# Patient Record
Sex: Male | Born: 1960 | Race: White | Hispanic: No | Marital: Married | Smoking: Never smoker
Health system: Southern US, Community
[De-identification: ages and names within clinical notes are randomized; demographics above are authoritative.]

## PROBLEM LIST (undated history)

## (undated) DIAGNOSIS — J45909 Unspecified asthma, uncomplicated: Secondary | ICD-10-CM

## (undated) HISTORY — PX: HERNIA REPAIR: SHX51

---

## 2010-04-09 ENCOUNTER — Ambulatory Visit: Payer: Self-pay | Admitting: Family Medicine

## 2012-02-10 IMAGING — CR DG CHEST 2V
1 series · 2 of 2 positions shown · non-contrast
Comparison: none

REASON FOR EXAM: cough
COMMENTS:

PROCEDURE:     DXR - DXR CHEST PA (OR AP) AND LATERAL  - April 09, 2010 [DATE]
RESULT:     The lungs are clear. The cardiac silhouette and visualized bony
skeleton are unremarkable.

[Series 1: view not recorded · 0.17mm/px · 2 of 2 slices shown]
[im 1/2]
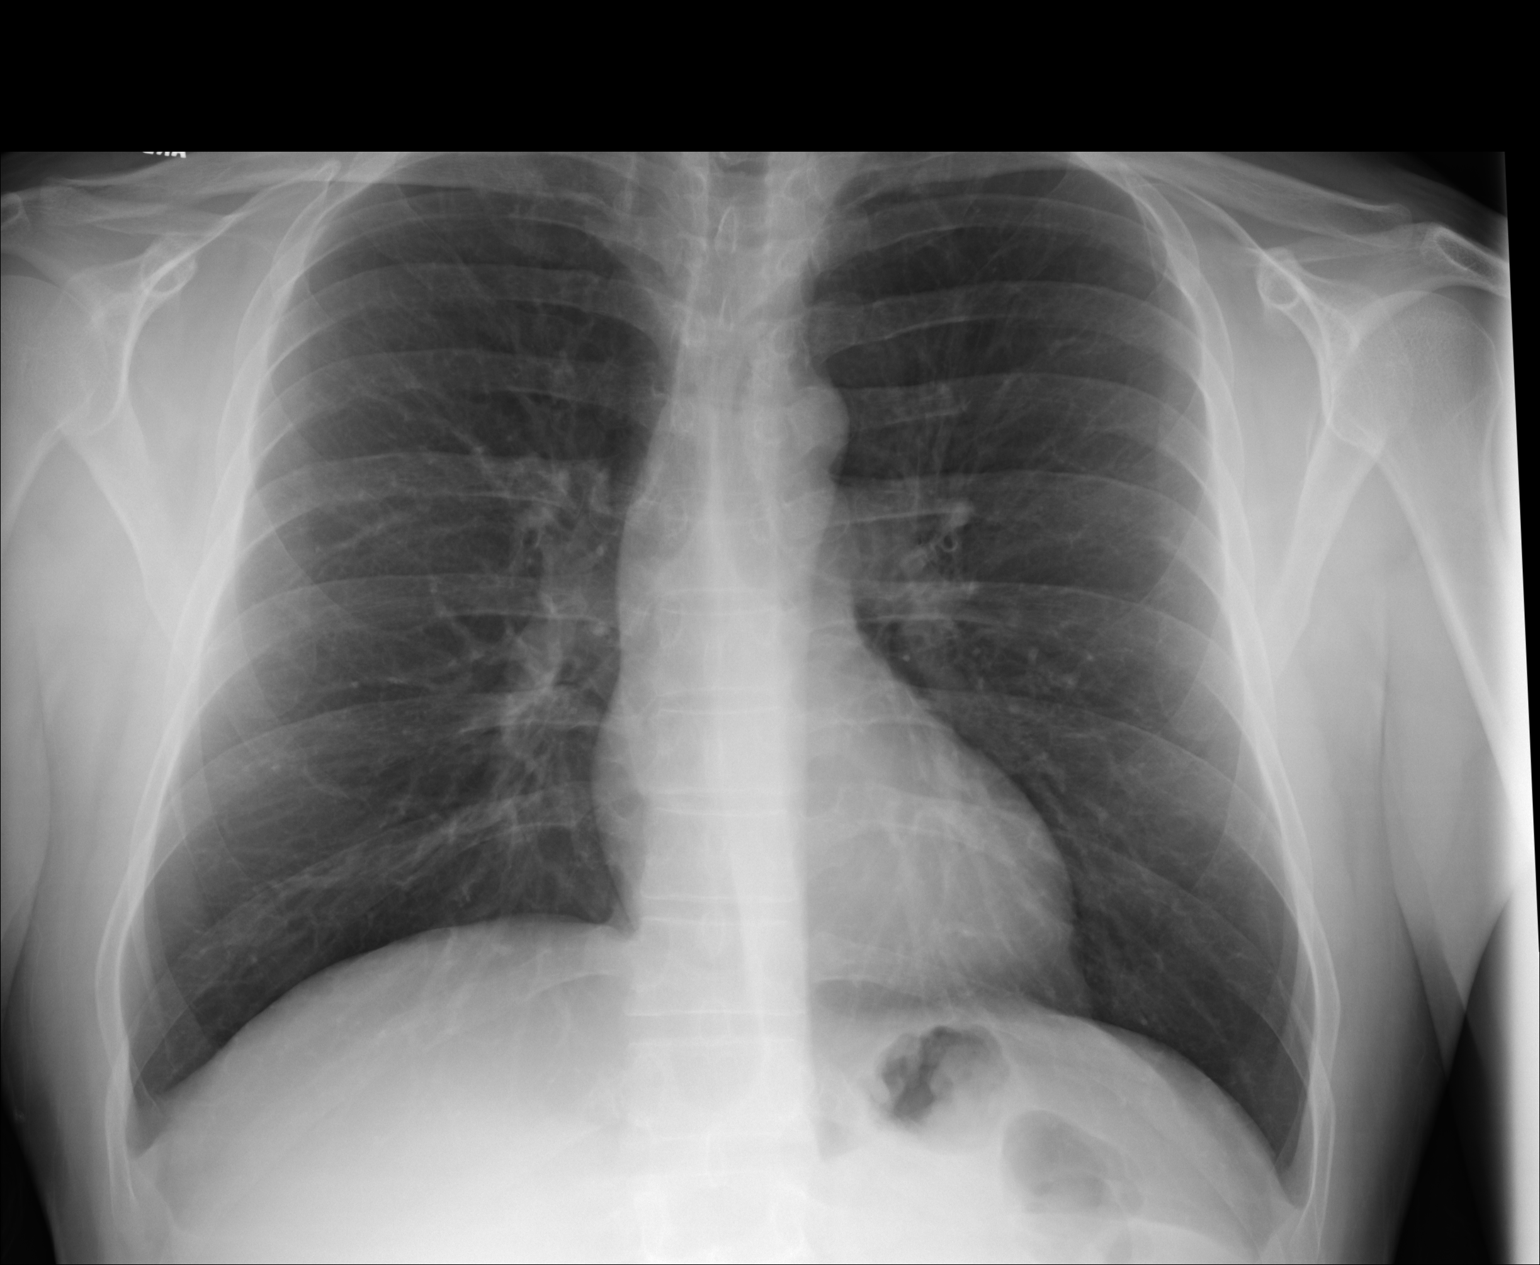
[im 2/2]
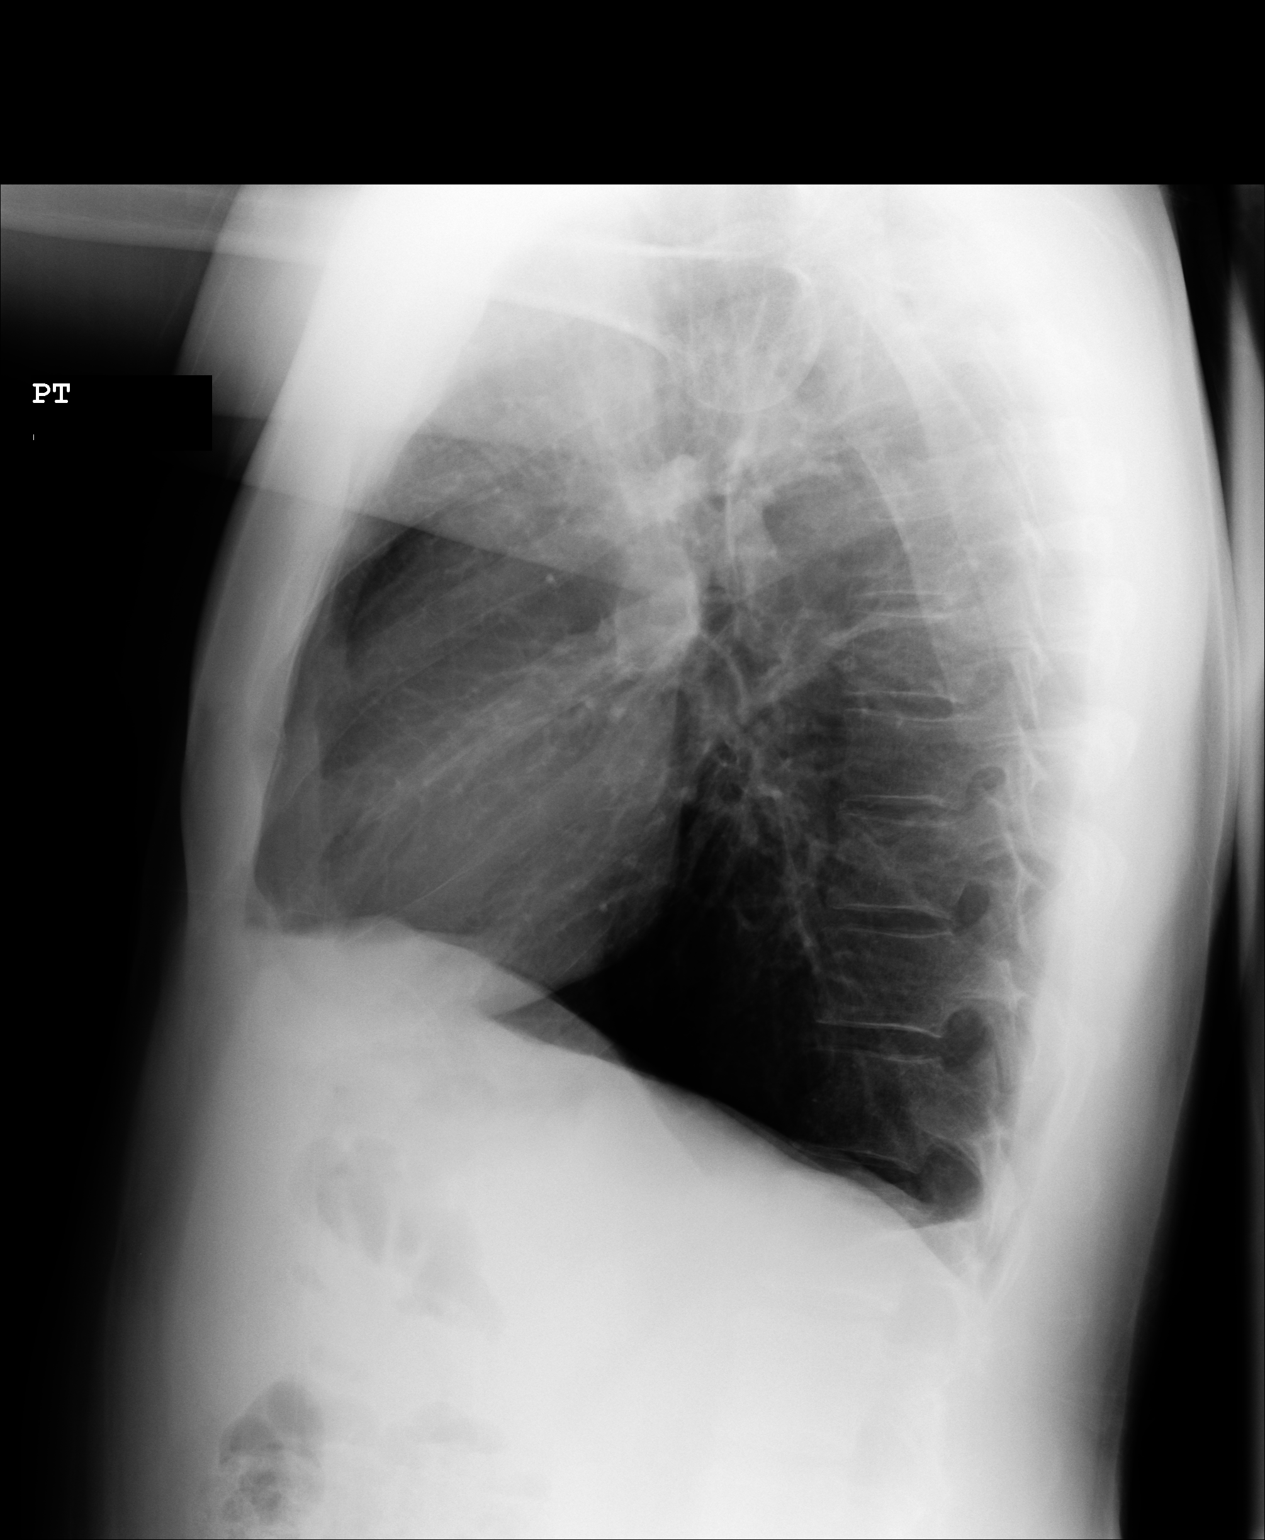

[2 of 2 positions shown; findings below may reference images not displayed]

IMPRESSION: Chest radiograph without evidence of acute cardiopulmonary disease.

## 2013-07-26 ENCOUNTER — Ambulatory Visit: Payer: Self-pay | Admitting: Gastroenterology

## 2020-04-21 ENCOUNTER — Emergency Department: Payer: BLUE CROSS/BLUE SHIELD

## 2020-04-21 ENCOUNTER — Other Ambulatory Visit: Payer: Self-pay

## 2020-04-21 DIAGNOSIS — K219 Gastro-esophageal reflux disease without esophagitis: Secondary | ICD-10-CM | POA: Diagnosis present

## 2020-04-21 DIAGNOSIS — Z79899 Other long term (current) drug therapy: Secondary | ICD-10-CM

## 2020-04-21 DIAGNOSIS — J45909 Unspecified asthma, uncomplicated: Secondary | ICD-10-CM | POA: Diagnosis present

## 2020-04-21 DIAGNOSIS — Z20822 Contact with and (suspected) exposure to covid-19: Secondary | ICD-10-CM | POA: Diagnosis present

## 2020-04-21 DIAGNOSIS — Z8249 Family history of ischemic heart disease and other diseases of the circulatory system: Secondary | ICD-10-CM

## 2020-04-21 DIAGNOSIS — E785 Hyperlipidemia, unspecified: Secondary | ICD-10-CM | POA: Diagnosis present

## 2020-04-21 DIAGNOSIS — I214 Non-ST elevation (NSTEMI) myocardial infarction: Principal | ICD-10-CM | POA: Diagnosis present

## 2020-04-21 DIAGNOSIS — Z28310 Unvaccinated for covid-19: Secondary | ICD-10-CM

## 2020-04-21 LAB — CBC
HCT: 45.4 % (ref 39.0–52.0)
Hemoglobin: 15.5 g/dL (ref 13.0–17.0)
MCH: 28.4 pg (ref 26.0–34.0)
MCHC: 34.1 g/dL (ref 30.0–36.0)
MCV: 83.3 fL (ref 80.0–100.0)
Platelets: 305 10*3/uL (ref 150–400)
RBC: 5.45 MIL/uL (ref 4.22–5.81)
RDW: 14.6 % (ref 11.5–15.5)
WBC: 9.8 10*3/uL (ref 4.0–10.5)
nRBC: 0 % (ref 0.0–0.2)

## 2020-04-21 NOTE — ED Notes (Signed)
First nurse note: pt to ED via EMS from home c/o substernal chest pain 2-3 hours PTA.  Given 3 SL nitro sprays by EMS, taken 5-6 baby ASA at home.  EMS vitals 160/90 BP, 75 HR, 95% RA, 144 CBG.

## 2020-04-21 NOTE — ED Triage Notes (Signed)
Pt presents to ER c/o mid-sternal chest pain that started around 1800 tonight.  Pt states pain was gradual onset on burning type chest pain.  Pt states he took 4 baby aspirin and 3 tums at home pta and at this moment, he states his pain is mostly gone.  Pt denies cardiac hx, HTN.  Pt A&O x4 at this time in NAD.

## 2020-04-22 ENCOUNTER — Inpatient Hospital Stay
Admission: EM | Admit: 2020-04-22 | Discharge: 2020-04-24 | DRG: 282 | Disposition: A | Discharge status: Home / Self Care | Payer: BLUE CROSS/BLUE SHIELD | Attending: Internal Medicine | Admitting: Internal Medicine

## 2020-04-22 ENCOUNTER — Encounter: Payer: Self-pay | Admitting: Internal Medicine

## 2020-04-22 DIAGNOSIS — Z79899 Other long term (current) drug therapy: Secondary | ICD-10-CM | POA: Diagnosis not present

## 2020-04-22 DIAGNOSIS — I214 Non-ST elevation (NSTEMI) myocardial infarction: Secondary | ICD-10-CM

## 2020-04-22 DIAGNOSIS — J45909 Unspecified asthma, uncomplicated: Secondary | ICD-10-CM

## 2020-04-22 DIAGNOSIS — Z20822 Contact with and (suspected) exposure to covid-19: Secondary | ICD-10-CM | POA: Diagnosis present

## 2020-04-22 DIAGNOSIS — E785 Hyperlipidemia, unspecified: Secondary | ICD-10-CM | POA: Diagnosis present

## 2020-04-22 DIAGNOSIS — Z8249 Family history of ischemic heart disease and other diseases of the circulatory system: Secondary | ICD-10-CM | POA: Diagnosis not present

## 2020-04-22 DIAGNOSIS — T7840XA Allergy, unspecified, initial encounter: Secondary | ICD-10-CM

## 2020-04-22 DIAGNOSIS — K219 Gastro-esophageal reflux disease without esophagitis: Secondary | ICD-10-CM

## 2020-04-22 DIAGNOSIS — Z28310 Unvaccinated for covid-19: Secondary | ICD-10-CM | POA: Diagnosis not present

## 2020-04-22 HISTORY — DX: Unspecified asthma, uncomplicated: J45.909

## 2020-04-22 LAB — BASIC METABOLIC PANEL
Anion gap: 10 (ref 5–15)
Anion gap: 7 (ref 5–15)
BUN: 23 mg/dL — ABNORMAL HIGH (ref 6–20)
BUN: 20 mg/dL (ref 6–20)
CO2: 27 mmol/L (ref 22–32)
CO2: 26 mmol/L (ref 22–32)
Calcium: 9.9 mg/dL (ref 8.9–10.3)
Calcium: 9.6 mg/dL (ref 8.9–10.3)
Chloride: 103 mmol/L (ref 98–111)
Chloride: 107 mmol/L (ref 98–111)
Creatinine, Ser: 0.94 mg/dL (ref 0.61–1.24)
Creatinine, Ser: 0.88 mg/dL (ref 0.61–1.24)
GFR, Estimated: >60 mL/min (ref >60)
GFR, Estimated: >60 mL/min (ref >60)
Glucose, Bld: 100 mg/dL — ABNORMAL HIGH (ref 70–99)
Glucose, Bld: 96 mg/dL (ref 70–99)
Potassium: 3.9 mmol/L (ref 3.5–5.1)
Potassium: 3.8 mmol/L (ref 3.5–5.1)
Sodium: 140 mmol/L (ref 135–145)
Sodium: 140 mmol/L (ref 135–145)

## 2020-04-22 LAB — TROPONIN I (HIGH SENSITIVITY)
Troponin I (High Sensitivity): 159 ng/L (ref <18)
Troponin I (High Sensitivity): 3408 ng/L (ref <18)
Troponin I (High Sensitivity): 4061 ng/L (ref <18)
Troponin I (High Sensitivity): 725 ng/L (ref <18)

## 2020-04-22 LAB — CBC
HCT: 45.5 % (ref 39.0–52.0)
Hemoglobin: 15.2 g/dL (ref 13.0–17.0)
MCH: 28.2 pg (ref 26.0–34.0)
MCHC: 33.4 g/dL (ref 30.0–36.0)
MCV: 84.4 fL (ref 80.0–100.0)
Platelets: 275 10*3/uL (ref 150–400)
RBC: 5.39 MIL/uL (ref 4.22–5.81)
RDW: 14.6 % (ref 11.5–15.5)
WBC: 8.3 10*3/uL (ref 4.0–10.5)
nRBC: 0 % (ref 0.0–0.2)

## 2020-04-22 LAB — HEPATIC FUNCTION PANEL
ALT: 14 U/L (ref 0–44)
AST: 25 U/L (ref 15–41)
Albumin: 2.5 g/dL — ABNORMAL LOW (ref 3.5–5.0)
Alkaline Phosphatase: 39 U/L (ref 38–126)
Bilirubin, Direct: 0.1 mg/dL (ref 0.0–0.2)
Indirect Bilirubin: 0.5 mg/dL (ref 0.3–0.9)
Total Bilirubin: 0.6 mg/dL (ref 0.3–1.2)
Total Protein: 5.8 g/dL — ABNORMAL LOW (ref 6.5–8.1)

## 2020-04-22 LAB — RESP PANEL BY RT-PCR (FLU A&B, COVID) ARPGX2
Influenza A by PCR: NEGATIVE
Influenza B by PCR: NEGATIVE
SARS Coronavirus 2 by RT PCR: NEGATIVE

## 2020-04-22 LAB — TSH: TSH: 3.621 u[IU]/mL (ref 0.350–4.500)

## 2020-04-22 LAB — PROTIME-INR
INR: 1 (ref 0.8–1.2)
Prothrombin Time: 12.7 seconds (ref 11.4–15.2)

## 2020-04-22 LAB — LIPID PANEL
Cholesterol: 200 mg/dL (ref 0–200)
HDL: 52 mg/dL (ref >40)
LDL Cholesterol: 124 mg/dL — ABNORMAL HIGH (ref 0–99)
Total CHOL/HDL Ratio: 3.8 RATIO
Triglycerides: 120 mg/dL (ref <150)
VLDL: 24 mg/dL (ref 0–40)

## 2020-04-22 LAB — HEPARIN LEVEL (UNFRACTIONATED)
Heparin Unfractionated: 0.33 IU/mL (ref 0.30–0.70)
Heparin Unfractionated: 0.17 IU/mL — ABNORMAL LOW (ref 0.30–0.70)
Heparin Unfractionated: 0.29 IU/mL — ABNORMAL LOW (ref 0.30–0.70)

## 2020-04-22 LAB — HEMOGLOBIN A1C
Hgb A1c MFr Bld: 5.7 % — ABNORMAL HIGH (ref 4.8–5.6)
Mean Plasma Glucose: 116.89 mg/dL

## 2020-04-22 LAB — APTT: aPTT: 136 seconds — ABNORMAL HIGH (ref 24–36)

## 2020-04-22 LAB — HIV ANTIBODY (ROUTINE TESTING W REFLEX): HIV Screen 4th Generation wRfx: NONREACTIVE

## 2020-04-22 LAB — MAGNESIUM: Magnesium: 2 mg/dL (ref 1.7–2.4)

## 2020-04-22 LAB — LIPASE, BLOOD: Lipase: 23 U/L (ref 11–51)

## 2020-04-22 MED ORDER — ATORVASTATIN CALCIUM 20 MG PO TABS
40.0000 mg | ORAL_TABLET | Freq: Every day | ORAL | Status: DC
  Administered 2020-04-22 – 2020-04-23 (×2): 40 mg via ORAL
  Filled 2020-04-22 (×2): qty 2

## 2020-04-22 MED ORDER — NITROGLYCERIN 0.4 MG SL SUBL: 0.4000 mg | SUBLINGUAL_TABLET | SUBLINGUAL | Status: DC | PRN

## 2020-04-22 MED ORDER — HEPARIN BOLUS VIA INFUSION
1100.0000 [IU] | Freq: Once | INTRAVENOUS | Status: AC
  Administered 2020-04-22: 1100 [IU] via INTRAVENOUS
  Filled 2020-04-22: qty 1100

## 2020-04-22 MED ORDER — ASPIRIN EC 81 MG PO TBEC
81.0000 mg | DELAYED_RELEASE_TABLET | Freq: Every day | ORAL | Status: DC
  Administered 2020-04-23: 81 mg via ORAL
  Filled 2020-04-22: qty 1

## 2020-04-22 MED ORDER — HEPARIN (PORCINE) 25000 UT/250ML-% IV SOLN: 14.0000 [IU]/kg/h | INTRAVENOUS | Status: DC

## 2020-04-22 MED ORDER — ONDANSETRON HCL 4 MG/2ML IJ SOLN: 4.0000 mg | Freq: Four times a day (QID) | INTRAMUSCULAR | Status: DC | PRN

## 2020-04-22 MED ORDER — FAMOTIDINE 20 MG PO TABS
40.0000 mg | ORAL_TABLET | Freq: Every day | ORAL | Status: DC
  Administered 2020-04-23: 40 mg via ORAL
  Filled 2020-04-22: qty 2

## 2020-04-22 MED ORDER — METOPROLOL TARTRATE 25 MG PO TABS
12.5000 mg | ORAL_TABLET | Freq: Two times a day (BID) | ORAL | Status: DC
  Administered 2020-04-22 – 2020-04-23 (×4): 12.5 mg via ORAL
  Filled 2020-04-22 (×4): qty 1

## 2020-04-22 MED ORDER — NITROGLYCERIN 2 % TD OINT
1.0000 [in_us] | TOPICAL_OINTMENT | Freq: Once | TRANSDERMAL | Status: AC
  Administered 2020-04-22: 1 [in_us] via TOPICAL
  Filled 2020-04-22: qty 1

## 2020-04-22 MED ORDER — FAMOTIDINE IN NACL 20-0.9 MG/50ML-% IV SOLN
20.0000 mg | Freq: Once | INTRAVENOUS | Status: AC
  Administered 2020-04-22: 20 mg via INTRAVENOUS
  Filled 2020-04-22: qty 50

## 2020-04-22 MED ORDER — HEPARIN SODIUM (PORCINE) 5000 UNIT/ML IJ SOLN
4000.0000 [IU] | Freq: Once | INTRAMUSCULAR | Status: AC
  Administered 2020-04-22: 4000 [IU] via INTRAVENOUS

## 2020-04-22 MED ORDER — HEPARIN (PORCINE) 25000 UT/250ML-% IV SOLN
1350.0000 [IU]/h | INTRAVENOUS | Status: DC
  Administered 2020-04-22: 1200 [IU]/h via INTRAVENOUS
  Administered 2020-04-22: 1000 [IU]/h via INTRAVENOUS
  Administered 2020-04-23: 1350 [IU]/h via INTRAVENOUS
  Filled 2020-04-22 (×3): qty 250

## 2020-04-22 MED ORDER — SODIUM CHLORIDE 0.9% FLUSH
3.0000 mL | Freq: Two times a day (BID) | INTRAVENOUS | Status: DC
  Administered 2020-04-22 – 2020-04-23 (×2): 3 mL via INTRAVENOUS

## 2020-04-22 MED ORDER — HEPARIN BOLUS VIA INFUSION
2100.0000 [IU] | Freq: Once | INTRAVENOUS | Status: AC
  Administered 2020-04-22: 2100 [IU] via INTRAVENOUS
  Filled 2020-04-22: qty 2100

## 2020-04-22 MED ORDER — MONTELUKAST SODIUM 10 MG PO TABS
10.0000 mg | ORAL_TABLET | Freq: Every day | ORAL | Status: DC
  Administered 2020-04-22 – 2020-04-23 (×2): 10 mg via ORAL
  Filled 2020-04-22 (×4): qty 1

## 2020-04-22 MED ORDER — ACETAMINOPHEN 325 MG PO TABS
650.0000 mg | ORAL_TABLET | ORAL | Status: DC | PRN
  Administered 2020-04-22: 650 mg via ORAL
  Filled 2020-04-22 (×3): qty 2

## 2020-04-22 NOTE — Progress Notes (Signed)
ANTICOAGULATION CONSULT NOTE   Pharmacy Consult for heparin infusion Indication: ACS/STEMI  No Known Allergies  Patient Measurements: Height: 5\' 6"  (167.6 cm) Weight: 72.6 kg (160 lb 1.6 oz) IBW/kg (Calculated) : 63.8 Heparin Dosing Weight: 73.5 kg  Vital Signs: Temp: 98.5 F (36.9 C) (04/09 1127) Temp Source: Oral (04/09 1127) BP: 127/79 (04/09 1127) Pulse Rate: 61 (04/09 1127)  Labs: Recent Labs    04/21/20 2206 04/22/20 0016 04/22/20 0401 04/22/20 0711 04/22/20 1343  HGB 15.5  --  15.2  --   --   HCT 45.4  --  45.5  --   --   PLT 305  --  275  --   --   APTT  --   --  136*  --   --   LABPROT  --   --  12.7  --   --   INR  --   --  1.0  --   --   HEPARINUNFRC  --   --   --  0.33 0.17*  CREATININE 0.94  --  0.88  --   --   TROPONINIHS 159* 725* 3,408* 4,061*  --     Estimated Creatinine Clearance: 83.6 mL/min (by C-G formula based on SCr of 0.88 mg/dL).   Medical History: Past Medical History:  Diagnosis Date  . Asthma     Assessment: Pt is 60 yo male presenting w/ CP, Troponin 159 >> 725  4/9 0711 HL 0.33  4/9 1343 HL 0.17   Goal of Therapy:  Heparin level 0.3-0.7 units/ml Monitor platelets by anticoagulation protocol: Yes   Plan:  Heparin level is subtherapeutic. Will give a 2100 units bolus and increase the heparin infusion to 1200 units/hr. Recheck heparin level in 6 hours. CBC daily while on heparin. Plan for cath Monday.   Wednesday, PharmD, BCPS 04/22/2020 2:19 PM

## 2020-04-22 NOTE — ED Notes (Signed)
Patient and wife updated on plan of care and lab results. Patient verbalized understanding.

## 2020-04-22 NOTE — Progress Notes (Signed)
ANTICOAGULATION CONSULT NOTE   Pharmacy Consult for heparin infusion Indication: ACS/STEMI  No Known Allergies  Patient Measurements: Height: 5\' 6"  (167.6 cm) Weight: 72.6 kg (160 lb 1.6 oz) IBW/kg (Calculated) : 63.8 Heparin Dosing Weight: 73.5 kg  Vital Signs: Temp: 98 F (36.7 C) (04/09 0727) Temp Source: Oral (04/09 0252) BP: 115/68 (04/09 0727) Pulse Rate: 57 (04/09 0727)  Labs: Recent Labs    04/21/20 2206 04/22/20 0016 04/22/20 0401 04/22/20 0711  HGB 15.5  --  15.2  --   HCT 45.4  --  45.5  --   PLT 305  --  275  --   APTT  --   --  136*  --   LABPROT  --   --  12.7  --   INR  --   --  1.0  --   HEPARINUNFRC  --   --   --  0.33  CREATININE 0.94  --  0.88  --   TROPONINIHS 159* 725* 3,408* 4,061*    Estimated Creatinine Clearance: 83.6 mL/min (by C-G formula based on SCr of 0.88 mg/dL).   Medical History: Past Medical History:  Diagnosis Date  . Asthma     Assessment: Pt is 60 yo male presenting w/ CP, Troponin 159 >> 725  4/9 0711 HL 0.33   Goal of Therapy:  Heparin level 0.3-0.7 units/ml Monitor platelets by anticoagulation protocol: Yes   Plan:  Heparin level is therapeutic. Will continue heparin infusion at 1000 unit/hr. Recheck heparin level in 6 hours. CBC daily while on heparin.   6/9, PharmD, BCPS 04/22/2020 8:01 AM

## 2020-04-22 NOTE — ED Notes (Signed)
See triage note. Patient reports pain to center chest after working in the yard. Patient reports pain has improved at this time, but is still present in the center chest. Patient denies SOB.

## 2020-04-22 NOTE — H&P (Addendum)
History and Physical   Ricky Ryan CNO:[id number] DOB: April 15, 1960 DOA: 04/22/2020  PCP: Ricky Housekeeper, MD   Patient coming from: Home  Chief Complaint: Chest pain  HPI: Ricky Ryan is a 60 y.o. male with medical history significant of asthma, GERD who presented with chest pain.  Patient had onset of chest pain around 6 PM this evening. He had a gradual increase in the pain which had a burning nature to it and was about a 10 out of 10 and located substernally.  He states he had had a meal of fish and onions about an hour before and after that he had pushed his grandkids on the swings for a little bit and then went to get gas and was changing to get ready to do some yard work when he began to have the pain.  He went out to tell his wife who gave him 3 baby aspirin's and then he took about 4 Tums as well due to the burning nature in the meal.  He then called EMS and took 4 additional baby aspirin's.  EMS also gave him some nitro as well in route.  By time he got to the ED his pain had improved around 1 out of 10.  Does report associated diaphoresis.  He denies history of heart disease or tobacco use. No obvious aggravating or alleviating factors.  States he does have an uncle on his mother side who had an MI in his 73s or 14s.  He denies fevers, chills, shortness of breath, abdominal pain, constipation, diarrhea, nausea, vomiting.   ED Course: Vital signs in the ED significant for heart rate in the 50s, blood pressure in the 120s 140 systolic.  Lab work-up showed BMP with BUN 23.  CBC within normal limits.  LFTs pending.  Troponin I 59 with repeat pending.  Lipase pending, respiratory panel flu COVID pending.  Chest x-ray without acute abnormality.  Patient received IV famotidine, nitro ointment, heparin infusion in ED.  Review of Systems: As per HPI otherwise all other systems reviewed and are negative.  Past Medical History:  Diagnosis Date  . Asthma     Past Surgical  History:  Procedure Laterality Date  . HERNIA REPAIR      Social History  reports that he has never smoked. He has never used smokeless tobacco. He reports that he does not drink alcohol and does not use drugs.  No Known Allergies  Family History  Problem Relation Age of Onset  . CVA Mother   . CVA Father   . Heart attack Maternal Uncle   Reviewed on admission  Prior to Admission medications   Medication Sig Start Date End Date Taking? Authorizing Provider  montelukast (SINGULAIR) 10 MG tablet Take 10 mg by mouth daily. 05/10/19 05/10/20 Yes [provider]  Flaxseed Oil (LINSEED OIL) OIL Use.    [provider]  Multiple Vitamin (MULTI-VITAMIN) tablet Take 1 tablet by mouth daily.    [provider]    Physical Exam: Vitals:   04/21/20 2155 04/22/20 0020  BP: 127/86 (!) 147/88  Pulse: (!) 55 (!) 47  Resp: 18 16  Temp: 98.3 F (36.8 C)   TempSrc: Oral   SpO2: 95% 98%  Weight: 73.5 kg   Height: 5\' 6"  (1.676 m)    Physical Exam Constitutional:      General: He is not in acute distress.    Appearance: Normal appearance.  HENT:     Head: Normocephalic and atraumatic.  Mouth/Throat:     Mouth: Mucous membranes are moist.     Pharynx: Oropharynx is clear.  Eyes:     Extraocular Movements: Extraocular movements intact.     Pupils: Pupils are equal, round, and reactive to light.  Cardiovascular:     Rate and Rhythm: Normal rate and regular rhythm.     Pulses: Normal pulses.     Heart sounds: Normal heart sounds.  Pulmonary:     Effort: Pulmonary effort is normal. No respiratory distress.     Breath sounds: Normal breath sounds.  Abdominal:     General: Bowel sounds are normal. There is no distension.     Palpations: Abdomen is soft.     Tenderness: There is no abdominal tenderness.  Musculoskeletal:        General: No swelling or deformity.  Skin:    General: Skin is warm and dry.  Neurological:     General: No focal deficit  present.     Mental Status: Mental status is at baseline.    Labs on Admission: I have personally reviewed following labs and imaging studies  CBC: Recent Labs  Lab 04/21/20 2206  WBC 9.8  HGB 15.5  HCT 45.4  MCV 83.3  PLT 305    Basic Metabolic Panel: Recent Labs  Lab 04/21/20 2206  NA 140  K 3.9  CL 103  CO2 27  GLUCOSE 100*  BUN 23*  CREATININE 0.94  CALCIUM 9.9    GFR: Estimated Creatinine Clearance: 78.2 mL/min (by C-G formula based on SCr of 0.94 mg/dL).  Liver Function Tests: Recent Labs  Lab 04/21/20 0432  AST 25  ALT 14  ALKPHOS 39  BILITOT 0.6  PROT 5.8*  ALBUMIN 2.5*    Urine analysis: No results found for: COLORURINE, APPEARANCEUR, LABSPEC, PHURINE, GLUCOSEU, HGBUR, BILIRUBINUR, KETONESUR, PROTEINUR, UROBILINOGEN, NITRITE, LEUKOCYTESUR  Radiological Exams on Admission: DG Chest 2 View  Result Date: 04/21/2020 CLINICAL DATA:  Chest pain EXAM: CHEST - 2 VIEW COMPARISON:  April 09, 2010 FINDINGS: The heart size and mediastinal contours are within normal limits. Both lungs are clear. The visualized skeletal structures are unremarkable. IMPRESSION: No active cardiopulmonary disease. Electronically Signed   By: Jonna Clark M.D.   On: 04/21/2020 22:35   EKG: Independently reviewed.  Sinus bradycardia at 59 bpm.  Assessment/Plan Principal Problem:   NSTEMI (non-ST elevated myocardial infarction) (HCC) Active Problems:   Asthma   Allergies   GERD (gastroesophageal reflux disease)  NSTEMI > Sternal chest pain after doing yard work and eating a meal. > So around 6 PM with gradual increase in a burning sensation. > Took 4 aspirins at home and 3 times > Had improvement in his pain to about 1 out of 10 by time he got to the ED. > No history of heart disease, non-smoker.  > Troponin I 59 in the ED started on heparin drip. - Message sent to cardiology for consult - Continue heparin drip - Trend troponin - Echo - Check A1c, lipid panel, TSH,  magnesium  Asthma - Continue daily Singulair  GERD > Due to burning sensation got IV famotidine in the ED - Continue with daily p.o. famotidine  DVT prophylaxis: Heparin Code Status:   Full Family Communication:  Wife updated at bedside Disposition Plan:   Patient is from:  Home  Anticipated DC to:  Home  Anticipated DC date:  1-3 days  Anticipated DC barriers: None Consults called:  Message sent to cardiology, Dr. Lady Gary. Admission status:  Observation,  progressive cardiac  Severity of Illness: The appropriate patient status for this patient is OBSERVATION. Observation status is judged to be reasonable and necessary in order to provide the required intensity of service to ensure the patient's safety. The patient's presenting symptoms, physical exam findings, and initial radiographic and laboratory data in the context of their medical condition is felt to place them at decreased risk for further clinical deterioration. Furthermore, it is anticipated that the patient will be medically stable for discharge from the hospital within 2 midnights of admission. The following factors support the patient status of observation.   " The patient's presenting symptoms include chest pain. " The physical exam findings include stable exam findings. " The initial radiographic and laboratory data are sinus bradycardia.  Troponin 159.   Synetta Fail MD [facility]  How to contact the [facility] Attending or Consulting provider 7A - 7P or covering provider during after hours 7P -7A, for this patient?   1. Check the care team in [facility] and look for a) attending/consulting [facility] provider listed and b) the [facility] team listed 2. Log into [email or web address] and use [facility]'s universal password to access. If you do not have the password, please contact the hospital operator. 3. Locate the [facility] provider you are looking for under [facility] and page to a number that you can be directly reached. 4. If you  still have difficulty reaching the provider, please page the [facility] (Director on Call) for the Hospitalists listed on amion for assistance.  04/22/2020, 1:22 AM

## 2020-04-22 NOTE — Plan of Care (Signed)

## 2020-04-22 NOTE — ED Notes (Signed)
Patient transported to inpatient unit by this RN. 

## 2020-04-22 NOTE — ED Notes (Signed)
Patient updated on admission status. Patient aware of POC and pending inpatient transport.

## 2020-04-22 NOTE — Progress Notes (Signed)
ANTICOAGULATION CONSULT NOTE - Initial Consult  Pharmacy Consult for heparin infusion Indication: ACS/STEMI  No Known Allergies  Patient Measurements: Height: 5\' 6"  (167.6 cm) Weight: 73.5 kg (162 lb) IBW/kg (Calculated) : 63.8 Heparin Dosing Weight: ACS/STEMI  Vital Signs: Temp: 98.3 F (36.8 C) (04/08 2155) Temp Source: Oral (04/08 2155) BP: 147/88 (04/09 0020) Pulse Rate: 47 (04/09 0020)  Labs: Recent Labs    04/21/20 2206 04/22/20 0016  HGB 15.5  --   HCT 45.4  --   PLT 305  --   CREATININE 0.94  --   TROPONINIHS 159* 725*    Estimated Creatinine Clearance: 78.2 mL/min (by C-G formula based on SCr of 0.94 mg/dL).   Medical History: History reviewed. No pertinent past medical history.  Assessment: Pt is 60 yo male presenting w/ CP, Troponin 159 >> 725  Goal of Therapy:  Heparin level 0.3-0.7 units/ml Monitor platelets by anticoagulation protocol: Yes   Plan:  Give 4000 units bolus x 1 Start heparin infusion at 1000 units/hr Check anti-Xa level in 6 hours and daily while on heparin Continue to monitor H&H and platelets  60, PharmD, [facility] 04/22/2020 1:27 AM

## 2020-04-22 NOTE — ED Notes (Signed)
Critical Result note  Troponin: 159 Called to: Dr. Scotty Court No new orders at this time.  Continue with repeat troponin.

## 2020-04-22 NOTE — ED Provider Notes (Signed)
[facility] Emergency Department Provider Note   ____________________________________________   Event Date/Time   First MD Initiated Contact with Patient 04/22/20 0015     (approximate)  I have reviewed the triage vital signs and the nursing notes.   HISTORY  Chief Complaint Chest Pain    HPI Ricky Ryan is a 60 y.o. male who presents to the ED from home with a chief complaint of chest pain.  Patient reports midsternal chest pressure/burning approximately 1 hour after eating fried fish and onions.  Symptoms associated with diaphoresis.  Denies associated shortness of breath, nausea/vomiting or dizziness.  Took several baby aspirin and 3 times prior to arrival with near relief of symptoms.  Denies fever, cough, abdominal pain, diarrhea.  Denies recent travel, trauma or hormone use.  Patient is unvaccinated against COVID-19.     Past medical history Asthma  There are no problems to display for this patient.   Past Surgical History:  Procedure Laterality Date  . HERNIA REPAIR      Prior to Admission medications   Not on File    Allergies Patient has no known allergies.  History reviewed. No pertinent family history.  Social History Social History   Tobacco Use  . Smoking status: Never Smoker  . Smokeless tobacco: Never Used  Substance Use Topics  . Alcohol use: Never  . Drug use: Never    Review of Systems  Constitutional: No fever/chills Eyes: No visual changes. ENT: No sore throat. Cardiovascular: Positive for chest pain. Respiratory: Denies shortness of breath. Gastrointestinal: No abdominal pain.  No nausea, no vomiting.  No diarrhea.  No constipation. Genitourinary: Negative for dysuria. Musculoskeletal: Negative for back pain. Skin: Negative for rash. Neurological: Negative for headaches, focal weakness or numbness.   ____________________________________________   PHYSICAL EXAM:  VITAL SIGNS: ED Triage Vitals   Enc Vitals Group     BP 04/21/20 2155 127/86     Pulse Rate 04/21/20 2155 (!) 55     Resp 04/21/20 2155 18     Temp 04/21/20 2155 98.3 F (36.8 C)     Temp Source 04/21/20 2155 Oral     SpO2 04/21/20 2155 95 %     Weight 04/21/20 2155 162 lb (73.5 kg)     Height 04/21/20 2155 5\' 6"  (1.676 m)     Head Circumference --      Peak Flow --      Pain Score 04/21/20 2204 2     Pain Loc --      Pain Edu? --      Excl. in GC? --     Constitutional: Alert and oriented. Well appearing and in no acute distress. Eyes: Conjunctivae are normal. PERRL. EOMI. Head: Atraumatic. Nose: No congestion/rhinnorhea. Mouth/Throat: Mucous membranes are moist.   Neck: No stridor.   Cardiovascular: Normal rate, regular rhythm. Grossly normal heart sounds.  Good peripheral circulation. Respiratory: Normal respiratory effort.  No retractions. Lungs CTAB. Gastrointestinal: Soft and nontender to light or deep palpation. No distention. No abdominal bruits. No CVA tenderness. Musculoskeletal: No lower extremity tenderness nor edema.  No joint effusions. Neurologic:  Normal speech and language. No gross focal neurologic deficits are appreciated. No gait instability. Skin:  Skin is warm, dry and intact. No rash noted. Psychiatric: Mood and affect are normal. Speech and behavior are normal.  ____________________________________________   LABS (all labs ordered are listed, but only abnormal results are displayed)  Labs Reviewed  BASIC METABOLIC PANEL - Abnormal; Notable for  the following components:      Result Value   Glucose, Bld 100 (*)    BUN 23 (*)    All other components within normal limits  TROPONIN I (HIGH SENSITIVITY) - Abnormal; Notable for the following components:   Troponin I (High Sensitivity) 159 (*)    All other components within normal limits  CBC  HEPATIC FUNCTION PANEL  LIPASE, BLOOD  TROPONIN I (HIGH SENSITIVITY)   ____________________________________________  EKG  ED ECG  REPORT I, Joeann Steppe J, the attending physician, personally viewed and interpreted this ECG.   Date: 04/22/2020  EKG Time: 2149  Rate: 59  Rhythm: normal EKG, normal sinus rhythm  Axis: RAD  Intervals:none  ST&T Change: Nonspecific  ____________________________________________  RADIOLOGY I, Ridhi Hoffert J, personally viewed and evaluated these images (plain radiographs) as part of my medical decision making, as well as reviewing the written report by the radiologist.  ED MD interpretation: No acute cardiopulmonary process  Official radiology report(s): DG Chest 2 View  Result Date: 04/21/2020 CLINICAL DATA:  Chest pain EXAM: CHEST - 2 VIEW COMPARISON:  April 09, 2010 FINDINGS: The heart size and mediastinal contours are within normal limits. Both lungs are clear. The visualized skeletal structures are unremarkable. IMPRESSION: No active cardiopulmonary disease. Electronically Signed   By: Jonna Clark M.D.   On: 04/21/2020 22:35    ____________________________________________   PROCEDURES  Procedure(s) performed (including Critical Care):  .1-3 Lead EKG Interpretation Performed by: Irean Hong, MD Authorized by: Irean Hong, MD     Interpretation: normal     ECG rate:  60   ECG rate assessment: normal     Rhythm: sinus rhythm     Ectopy: none     Conduction: normal   Comments:     Patient placed on cardiac monitor to evaluate for arrhythmias    CRITICAL CARE Performed by: Irean Hong   Total critical care time: 30 minutes  Critical care time was exclusive of separately billable procedures and treating other patients.  Critical care was necessary to treat or prevent imminent or life-threatening deterioration.  Critical care was time spent personally by me on the following activities: development of treatment plan with patient and/or surrogate as well as nursing, discussions with consultants, evaluation of patient's response to treatment, examination of patient,  obtaining history from patient or surrogate, ordering and performing treatments and interventions, ordering and review of laboratory studies, ordering and review of radiographic studies, pulse oximetry and re-evaluation of patient's condition.  ____________________________________________   INITIAL IMPRESSION / ASSESSMENT AND PLAN / ED COURSE  As part of my medical decision making, I reviewed the following data within the electronic MEDICAL RECORD NUMBER Nursing notes reviewed and incorporated, Labs reviewed, EKG interpreted, Old chart reviewed, Radiograph reviewed, Discussed with admitting physician and Notes from prior ED visits     60 year old male presenting with chest pain. Differential diagnosis includes, but is not limited to, ACS, aortic dissection, pulmonary embolism, cardiac tamponade, pneumothorax, pneumonia, pericarditis, myocarditis, GI-related causes including esophagitis/gastritis, and musculoskeletal chest wall pain.    Laboratory results demonstrate initial troponin I59. Will start heparin bolus with gtt, nitroglycerin paste, IV Pepcid.  Will discuss with hospitalist services for admission.      ____________________________________________   FINAL CLINICAL IMPRESSION(S) / ED DIAGNOSES  Final diagnoses:  NSTEMI (non-ST elevated myocardial infarction) [facility])     ED Discharge Orders    None      *Please note:  Ricky Ryan was evaluated in Emergency Department  on 04/22/2020 for the symptoms described in the history of present illness. He was evaluated in the context of the global COVID-19 pandemic, which necessitated consideration that the patient might be at risk for infection with the SARS-CoV-2 virus that causes COVID-19. Institutional protocols and algorithms that pertain to the evaluation of patients at risk for COVID-19 are in a state of rapid change based on information released by regulatory bodies including the CDC and federal and state organizations. These policies  and algorithms were followed during the patient's care in the ED.  Some ED evaluations and interventions may be delayed as a result of limited staffing during and the pandemic.*   Note:  This document was prepared using Dragon voice recognition software and may include unintentional dictation errors.   Irean Hong, MD 04/22/20 [phone]

## 2020-04-22 NOTE — Progress Notes (Signed)
ANTICOAGULATION CONSULT NOTE   Pharmacy Consult for heparin infusion Indication: ACS/STEMI  No Known Allergies  Patient Measurements: Height: 5\' 6"  (167.6 cm) Weight: 72.6 kg (160 lb 1.6 oz) IBW/kg (Calculated) : 63.8 Heparin Dosing Weight: 73.5 kg  Vital Signs: Temp: 98.2 F (36.8 C) (04/09 2003) Temp Source: Oral (04/09 2003) BP: 129/78 (04/09 2003) Pulse Rate: 66 (04/09 2003)  Labs: Recent Labs    04/21/20 2206 04/22/20 0016 04/22/20 0401 04/22/20 0711 04/22/20 1343 04/22/20 2047  HGB 15.5  --  15.2  --   --   --   HCT 45.4  --  45.5  --   --   --   PLT 305  --  275  --   --   --   APTT  --   --  136*  --   --   --   LABPROT  --   --  12.7  --   --   --   INR  --   --  1.0  --   --   --   HEPARINUNFRC  --   --   --  0.33 0.17* 0.29*  CREATININE 0.94  --  0.88  --   --   --   TROPONINIHS 159* 725* 3,408* 4,061*  --   --     Estimated Creatinine Clearance: 83.6 mL/min (by C-G formula based on SCr of 0.88 mg/dL).   Medical History: Past Medical History:  Diagnosis Date  . Asthma     Assessment: Pt is 60 yo male presenting w/ CP, Troponin 159 >> 725. Pharmacy has been consulted for heparin infusion dosing.  Tonight, heparin level remains in subtherapeutic range at 0.29 but trended up from bolus and infusion rate increase earlier today. Hgb and platelets WNL. No issues with the infusion or overt bleeding noted per RN.  Goal of Therapy:  Heparin level 0.3-0.7 units/ml Monitor platelets by anticoagulation protocol: Yes   Plan:  Heparin bolus 1100 units x 1 Increase heparin infusion to 1350 units/hr Check HL in 6 hours Monitor CBC, daily heparin level  Continue to monitor for signs/symptoms of bleeding F/u plans post cath (Monday)   06-20-1991, PharmD Clinical Pharmacist  04/22/2020   9:37 PM

## 2020-04-22 NOTE — Consult Note (Signed)
Cardiology Consultation Note    Patient ID: Ricky Ryan, MRN: [id number], DOB/AGE: 02/03/1960 60 y.o. Admit date: 04/22/2020   Date of Consult: 04/22/2020 Primary Physician: Dione Housekeeper, MD Primary Cardiologist: None  Chief Complaint: chest pain Reason for Consultation: nstemi Requesting MD: Dr. Enedina Finner  HPI: Ricky Ryan is a 60 y.o. male with history of asthma and GERD but no prior cardiac disease who presented to the er after developing mid sternal chest pain which occurred after playing with his grandkids and preparing to do yard work. He was given asa and TUMS by his wife. EMS arrived and nitro was given. Pain improved by arrival to er and EKG did not suggest stemi. He has ruled in for an mi. Risk factors include family history of cad. Peak hsTroponin is 4061. Echo pending. Pain free at present.   Past Medical History:  Diagnosis Date  . Asthma       Surgical History:  Past Surgical History:  Procedure Laterality Date  . HERNIA REPAIR       Home Meds: Prior to Admission medications   Medication Sig Start Date End Date Taking? Authorizing Provider  Flaxseed Oil (LINSEED OIL) OIL Use.   Yes [provider]  montelukast (SINGULAIR) 10 MG tablet Take 10 mg by mouth daily. 05/10/19 05/10/20 Yes [provider]  Multiple Vitamin (MULTI-VITAMIN) tablet Take 1 tablet by mouth daily.   Yes [provider]    Inpatient Medications:  . [START ON 04/23/2020] aspirin EC  81 mg Oral Daily  . atorvastatin  40 mg Oral Daily  . [START ON 04/23/2020] famotidine  40 mg Oral Daily  . metoprolol tartrate  12.5 mg Oral BID  . montelukast  10 mg Oral Daily  . sodium chloride flush  3 mL Intravenous Q12H   . heparin 1,000 Units/hr (04/22/20 0253)    Allergies: No Known Allergies  Social History   Socioeconomic History  . Marital status: Married    Spouse name: Not on file  . Number of children: Not on file  . Years of education: Not on  file  . Highest education level: Not on file  Occupational History  . Not on file  Tobacco Use  . Smoking status: Never Smoker  . Smokeless tobacco: Never Used  Substance and Sexual Activity  . Alcohol use: Never  . Drug use: Never  . Sexual activity: Not on file  Other Topics Concern  . Not on file  Social History Narrative  . Not on file   Social Determinants of Health   Financial Resource Strain: Not on file  Food Insecurity: Not on file  Transportation Needs: Not on file  Physical Activity: Not on file  Stress: Not on file  Social Connections: Not on file  Intimate Partner Violence: Not on file     Family History  Problem Relation Age of Onset  . CVA Mother   . CVA Father   . Heart attack Maternal Uncle      Review of Systems: A 12-system review of systems was performed and is negative except as noted in the HPI.  Labs: No results for input(s): CKTOTAL, CKMB, TROPONINI in the last 72 hours. Lab Results  Component Value Date   WBC 8.3 04/22/2020   HGB 15.2 04/22/2020   HCT 45.5 04/22/2020   MCV 84.4 04/22/2020   PLT 275 04/22/2020    Recent Labs  Lab 04/21/20 0432 04/21/20 2206 04/22/20 0401  NA  --    < >  140  K  --    < > 3.8  CL  --    < > 107  CO2  --    < > 26  BUN  --    < > 20  CREATININE  --    < > 0.88  CALCIUM  --    < > 9.6  PROT 5.8*  --   --   BILITOT 0.6  --   --   ALKPHOS 39  --   --   ALT 14  --   --   AST 25  --   --   GLUCOSE  --    < > 96   < > = values in this interval not displayed.   Lab Results  Component Value Date   CHOL 200 04/22/2020   HDL 52 04/22/2020   LDLCALC 124 (H) 04/22/2020   TRIG 120 04/22/2020   No results found for: DDIMER  Radiology/Studies:  DG Chest 2 View  Result Date: 04/21/2020 CLINICAL DATA:  Chest pain EXAM: CHEST - 2 VIEW COMPARISON:  April 09, 2010 FINDINGS: The heart size and mediastinal contours are within normal limits. Both lungs are clear. The visualized skeletal structures are  unremarkable. IMPRESSION: No active cardiopulmonary disease. Electronically Signed   By: Jonna Clark M.D.   On: 04/21/2020 22:35    Wt Readings from Last 3 Encounters:  04/22/20 72.6 kg    EKG: NSR with nonspecific st t wave changes.   Physical Exam:Well appearing male Blood pressure 115/68, pulse (!) 57, temperature 98 F (36.7 C), resp. rate 16, height 5\' 6"  (1.676 m), weight 72.6 kg, SpO2 96 %. Body mass index is 25.84 kg/m. General: Well developed, well nourished, in no acute distress. Head: Normocephalic, atraumatic, sclera non-icteric, no xanthomas, nares are without discharge.  Neck: Negative for carotid bruits. JVD not elevated. Lungs: Clear bilaterally to auscultation without wheezes, rales, or rhonchi. Breathing is unlabored. Heart: RRR with S1 S2. No murmurs, rubs, or gallops appreciated. Abdomen: Soft, non-tender, non-distended with normoactive bowel sounds. No hepatomegaly. No rebound/guarding. No obvious abdominal masses. Msk:  Strength and tone appear normal for age. Extremities: No clubbing or cyanosis. No edema.  Distal pedal pulses are 2+ and equal bilaterally. Neuro: Alert and oriented X 3. No facial asymmetry. No focal deficit. Moves all extremities spontaneously. Psych:  Responds to questions appropriately with a normal affect.     Assessment and Plan  60 yo male with no prior cardiac disease who presented with chest pain and ruled in for nstemi.   NSTEMI-will continue with asa, statin, heparin, beta bocker and proceed with left heart cath on Monday to evaluate anatomy. Echo pending.   Signed, Wednesday MD 04/22/2020, 9:08 AM Pager: [phone]

## 2020-04-22 NOTE — Progress Notes (Signed)
[facility]  - [place] at [facility]   PATIENT NAME: Ricky Ryan    MR#:  [id number]  DATE OF BIRTH:  August 01, 1960  SUBJECTIVE:  No cp or sob Wife at bedside  REVIEW OF SYSTEMS:   Review of Systems  Constitutional: Negative for chills, fever and weight loss.  HENT: Negative for ear discharge, ear pain and nosebleeds.   Eyes: Negative for blurred vision, pain and discharge.  Respiratory: Negative for sputum production, shortness of breath, wheezing and stridor.   Cardiovascular: Negative for chest pain, palpitations, orthopnea and PND.  Gastrointestinal: Negative for abdominal pain, diarrhea, nausea and vomiting.  Genitourinary: Negative for frequency and urgency.  Musculoskeletal: Negative for back pain and joint pain.  Neurological: Negative for sensory change, speech change, focal weakness and weakness.  Psychiatric/Behavioral: Negative for depression and hallucinations. The patient is not nervous/anxious.    Tolerating Diet:yes Tolerating PT:   DRUG ALLERGIES:  No Known Allergies  VITALS:  Blood pressure 127/79, pulse 61, temperature 98.5 F (36.9 C), temperature source Oral, resp. rate 16, height 5\' 6"  (1.676 m), weight 72.6 kg, SpO2 96 %.  PHYSICAL EXAMINATION:   Physical Exam  GENERAL:  60 y.o.-year-old patient lying in the bed with no acute distress.  LUNGS: Normal breath sounds bilaterally, no wheezing, rales, rhonchi. No use of accessory muscles of respiration.  CARDIOVASCULAR: S1, S2 normal. No murmurs, rubs, or gallops.  ABDOMEN: Soft, nontender, nondistended. Bowel sounds present. No organomegaly or mass.  EXTREMITIES: No cyanosis, clubbing or edema b/l.    NEUROLOGIC: Cranial nerves II through XII are intact. No focal Motor or sensory deficits b/l.   PSYCHIATRIC:  patient is alert and oriented x 3.  SKIN: No obvious rash, lesion, or ulcer.   LABORATORY PANEL:  CBC Recent Labs  Lab 04/22/20 0401  WBC 8.3  HGB 15.2  HCT 45.5  PLT 275     Chemistries  Recent Labs  Lab 04/21/20 0432 04/21/20 2206 04/22/20 0401  NA  --    < > 140  K  --    < > 3.8  CL  --    < > 107  CO2  --    < > 26  GLUCOSE  --    < > 96  BUN  --    < > 20  CREATININE  --    < > 0.88  CALCIUM  --    < > 9.6  MG  --   --  2.0  AST 25  --   --   ALT 14  --   --   ALKPHOS 39  --   --   BILITOT 0.6  --   --    < > = values in this interval not displayed.   Cardiac Enzymes No results for input(s): TROPONINI in the last 168 hours. RADIOLOGY:  DG Chest 2 View  Result Date: 04/21/2020 CLINICAL DATA:  Chest pain EXAM: CHEST - 2 VIEW COMPARISON:  April 09, 2010 FINDINGS: The heart size and mediastinal contours are within normal limits. Both lungs are clear. The visualized skeletal structures are unremarkable. IMPRESSION: No active cardiopulmonary disease. Electronically Signed   By: April 11, 2010 M.D.   On: 04/21/2020 22:35   ASSESSMENT AND PLAN:  Ricky Ryan is a 60 y.o. male with medical history significant of asthma, GERD who presented with chest pain. Patient had onset of chest pain around 6 PM this evening. He had a gradual increase in the pain which had  a burning nature to it and was about a 10 out of 10 and located substernally   NSTEMI --presented with Sternal chest pain after doing yard work and eating a meal. -- Troponin [id number]  - [facility] cardiology Dr Lady Gary recommends cath on monday - Continue heparin drip, ASA, add low dose BB, lipitor --prn nitro --pt cp free - Echo  Hyperlipidemia --started on statins  Asthma - Continue daily Singulair  GERD - Continue with daily p.o. famotidine  DVT prophylaxis:      Heparin Code Status:              Full Family Communication:       Wife updated at bedside Disposition Plan:              Patient is from:                        Home             Anticipated DC to:                   Home             Anticipated DC date:               1-3 days             Anticipated DC  barriers:         None Consults called:    cardiology, Dr. Lady Gary Level of care: Progressive Cardiac Status is: Inpatient  Remains inpatient appropriate because:Inpatient level of care appropriate due to severity of illness  Cath on monday       TOTAL TIME TAKING CARE OF THIS PATIENT: *25* minutes.  >50% time spent on counselling and coordination of care  Note: This dictation was prepared with Dragon dictation along with smaller phrase technology. Any transcriptional errors that result from this process are unintentional.  Enedina Finner M.D    [facility]   CC: Primary care physician; Dione Housekeeper, MDPatient ID: Ricky Ryan, male   DOB: 1960/07/09, 60 y.o.   MRN: [id number]

## 2020-04-23 ENCOUNTER — Inpatient Hospital Stay
Admit: 2020-04-23 | Discharge: 2020-04-23 | Disposition: A | Discharge status: Home / Self Care | Payer: BLUE CROSS/BLUE SHIELD | Attending: Internal Medicine | Admitting: Internal Medicine

## 2020-04-23 LAB — CBC
HCT: 47.3 % (ref 39.0–52.0)
HCT: 47.8 % (ref 39.0–52.0)
Hemoglobin: 15.8 g/dL (ref 13.0–17.0)
Hemoglobin: 15.7 g/dL (ref 13.0–17.0)
MCH: 28 pg (ref 26.0–34.0)
MCH: 27.7 pg (ref 26.0–34.0)
MCHC: 33.4 g/dL (ref 30.0–36.0)
MCHC: 32.8 g/dL (ref 30.0–36.0)
MCV: 83.9 fL (ref 80.0–100.0)
MCV: 84.3 fL (ref 80.0–100.0)
Platelets: 269 10*3/uL (ref 150–400)
Platelets: 287 10*3/uL (ref 150–400)
RBC: 5.64 MIL/uL (ref 4.22–5.81)
RBC: 5.67 MIL/uL (ref 4.22–5.81)
RDW: 14.5 % (ref 11.5–15.5)
RDW: 14.6 % (ref 11.5–15.5)
WBC: 9.5 10*3/uL (ref 4.0–10.5)
WBC: 8.5 10*3/uL (ref 4.0–10.5)
nRBC: 0 % (ref 0.0–0.2)
nRBC: 0 % (ref 0.0–0.2)

## 2020-04-23 LAB — BASIC METABOLIC PANEL
Anion gap: 8 (ref 5–15)
BUN: 18 mg/dL (ref 6–20)
CO2: 26 mmol/L (ref 22–32)
Calcium: 9.2 mg/dL (ref 8.9–10.3)
Chloride: 106 mmol/L (ref 98–111)
Creatinine, Ser: 0.91 mg/dL (ref 0.61–1.24)
GFR, Estimated: >60 mL/min (ref >60)
Glucose, Bld: 97 mg/dL (ref 70–99)
Potassium: 4.1 mmol/L (ref 3.5–5.1)
Sodium: 140 mmol/L (ref 135–145)

## 2020-04-23 LAB — ECHOCARDIOGRAM COMPLETE
AR max vel: 1.8 cm2
AV Peak grad: 8.2 mmHg
Ao pk vel: 1.43 m/s
Area-P 1/2: 3.99 cm2
S' Lateral: 2.6 cm
Weight: 2561.6 oz

## 2020-04-23 LAB — HEPARIN LEVEL (UNFRACTIONATED)
Heparin Unfractionated: 0.56 IU/mL (ref 0.30–0.70)
Heparin Unfractionated: 0.42 IU/mL (ref 0.30–0.70)

## 2020-04-23 MED ORDER — SODIUM CHLORIDE 0.9 % WEIGHT BASED INFUSION
1.0000 mL/kg/h | INTRAVENOUS | Status: DC
  Administered 2020-04-24: 1 mL/kg/h via INTRAVENOUS

## 2020-04-23 MED ORDER — SODIUM CHLORIDE 0.9 % IV SOLN: 250.0000 mL | INTRAVENOUS | Status: DC | PRN

## 2020-04-23 MED ORDER — SODIUM CHLORIDE 0.9% FLUSH: 3.0000 mL | INTRAVENOUS | Status: DC | PRN

## 2020-04-23 MED ORDER — SODIUM CHLORIDE 0.9 % WEIGHT BASED INFUSION
3.0000 mL/kg/h | INTRAVENOUS | Status: AC
  Administered 2020-04-24: 3 mL/kg/h via INTRAVENOUS

## 2020-04-23 MED ORDER — ASPIRIN 81 MG PO CHEW
81.0000 mg | CHEWABLE_TABLET | ORAL | Status: AC
  Administered 2020-04-24: 81 mg via ORAL
  Filled 2020-04-23: qty 1

## 2020-04-23 NOTE — Progress Notes (Signed)
ANTICOAGULATION CONSULT NOTE   Pharmacy Consult for heparin infusion Indication: ACS/STEMI  No Known Allergies  Patient Measurements: Height: 5\' 6"  (167.6 cm) Weight: 72.6 kg (160 lb 1.6 oz) IBW/kg (Calculated) : 63.8 Heparin Dosing Weight: 73.5 kg  Vital Signs: Temp: 98.5 F (36.9 C) (04/10 0728) Temp Source: Oral (04/10 0728) BP: 113/75 (04/10 0728) Pulse Rate: 61 (04/10 0728)  Labs: Recent Labs    04/21/20 2206 04/22/20 0016 04/22/20 0401 04/22/20 0711 04/22/20 1343 04/22/20 2047 04/23/20 0536 04/23/20 1132  HGB 15.5  --  15.2  --   --   --  15.8 15.7  HCT 45.4  --  45.5  --   --   --  47.3 47.8  PLT 305  --  275  --   --   --  269 287  APTT  --   --  136*  --   --   --   --   --   LABPROT  --   --  12.7  --   --   --   --   --   INR  --   --  1.0  --   --   --   --   --   HEPARINUNFRC  --   --   --  0.33   < > 0.29* 0.56 0.42  CREATININE 0.94  --  0.88  --   --   --   --  0.91  TROPONINIHS 159* 725* 3,408* 4,061*  --   --   --   --    < > = values in this interval not displayed.    Estimated Creatinine Clearance: 80.8 mL/min (by C-G formula based on SCr of 0.91 mg/dL).   Medical History: Past Medical History:  Diagnosis Date  . Asthma     Assessment: Pt is 60 yo male presenting w/ CP, Troponin 159 >> 725. Pharmacy has been consulted for heparin infusion dosing.  Goal of Therapy:  Heparin level 0.3-0.7 units/ml Monitor platelets by anticoagulation protocol: Yes   4/10 0536 HL 0.56, therapeutic  4/10 1132 HL 0.42, therapeutic.   Plan:  Heparin level is therapeutic. Will continue heparin infusion at 1350 units/hr. Recheck heparin level and CBC with AM labs. Plan for cath on Monday.   Saturday, PharmD, BCPS 04/23/2020 12:23 PM

## 2020-04-23 NOTE — Progress Notes (Signed)
[facility]  - [place] at [facility]   PATIENT NAME: Ricky Ryan    MR#:  [id number]  DATE OF BIRTH:  03/23/1960  SUBJECTIVE:  No cp or sob uneventful day. Wife at bedside  REVIEW OF SYSTEMS:   Review of Systems  Constitutional: Negative for chills, fever and weight loss.  HENT: Negative for ear discharge, ear pain and nosebleeds.   Eyes: Negative for blurred vision, pain and discharge.  Respiratory: Negative for sputum production, shortness of breath, wheezing and stridor.   Cardiovascular: Negative for chest pain, palpitations, orthopnea and PND.  Gastrointestinal: Negative for abdominal pain, diarrhea, nausea and vomiting.  Genitourinary: Negative for frequency and urgency.  Musculoskeletal: Negative for back pain and joint pain.  Neurological: Negative for sensory change, speech change, focal weakness and weakness.  Psychiatric/Behavioral: Negative for depression and hallucinations. The patient is not nervous/anxious.    Tolerating Diet:yes Tolerating PT: self ambulatory  DRUG ALLERGIES:  No Known Allergies  VITALS:  Blood pressure 113/75, pulse 61, temperature 98.5 F (36.9 C), temperature source Oral, resp. rate 16, height 5\' 6"  (1.676 m), weight 72.6 kg, SpO2 98 %.  PHYSICAL EXAMINATION:   Physical Exam  GENERAL:  60 y.o.-year-old patient lying in the bed with no acute distress.  LUNGS: Normal breath sounds bilaterally, no wheezing, rales, rhonchi. No use of accessory muscles of respiration.  CARDIOVASCULAR: S1, S2 normal. No murmurs, rubs, or gallops.  ABDOMEN: Soft, nontender, nondistended. Bowel sounds present. No organomegaly or mass.  EXTREMITIES: No cyanosis, clubbing or edema b/l.    NEUROLOGIC: Cranial nerves II through XII are intact. No focal Motor or sensory deficits b/l.   PSYCHIATRIC:  patient is alert and oriented x 3.  SKIN: No obvious rash, lesion, or ulcer.   LABORATORY PANEL:  CBC Recent Labs  Lab 04/23/20 0536  WBC 9.5   HGB 15.8  HCT 47.3  PLT 269    Chemistries  Recent Labs  Lab 04/21/20 0432 04/21/20 2206 04/22/20 0401  NA  --    < > 140  K  --    < > 3.8  CL  --    < > 107  CO2  --    < > 26  GLUCOSE  --    < > 96  BUN  --    < > 20  CREATININE  --    < > 0.88  CALCIUM  --    < > 9.6  MG  --   --  2.0  AST 25  --   --   ALT 14  --   --   ALKPHOS 39  --   --   BILITOT 0.6  --   --    < > = values in this interval not displayed.   Cardiac Enzymes No results for input(s): TROPONINI in the last 168 hours. RADIOLOGY:  DG Chest 2 View  Result Date: 04/21/2020 CLINICAL DATA:  Chest pain EXAM: CHEST - 2 VIEW COMPARISON:  April 09, 2010 FINDINGS: The heart size and mediastinal contours are within normal limits. Both lungs are clear. The visualized skeletal structures are unremarkable. IMPRESSION: No active cardiopulmonary disease. Electronically Signed   By: April 11, 2010 M.D.   On: 04/21/2020 22:35   ASSESSMENT AND PLAN:  Jaelyn Cloninger is a 60 y.o. male with medical history significant of asthma, GERD who presented with chest pain. Patient had onset of chest pain around 6 PM this evening. He had a gradual increase in the  pain which had a burning nature to it and was about a 10 out of 10 and located substernally   NSTEMI --presented with Sternal chest pain after doing yard work and eating a meal. -- Troponin [id number]  - [facility] cardiology Dr Lady Gary recommends cath on monday - Continue heparin drip, ASA, add low dose BB, lipitor --prn nitro --pt cp free - Echo pending  Hyperlipidemia -- on statins  Asthma -- Continue daily Singulair  GERD -- po famotidine  DVT prophylaxis:      Heparin Code Status:              Full Family Communication:       Wife updated at bedside Disposition Plan:              Patient is from:                        Home             Anticipated DC to:                   Home             Anticipated DC date:               1-3 days              Anticipated DC barriers:         None Consults called:    cardiology, Dr. Lady Gary Level of care: Progressive Cardiac Status is: Inpatient  Remains inpatient appropriate because:Inpatient level of care appropriate due to severity of illness  Cath on monday       TOTAL TIME TAKING CARE OF THIS PATIENT: *25* minutes.  >50% time spent on counselling and coordination of care  Note: This dictation was prepared with Dragon dictation along with smaller phrase technology. Any transcriptional errors that result from this process are unintentional.  Enedina Finner M.D    [facility]   CC: Primary care physician; Dione Housekeeper, MDPatient ID: Ricky Ryan, male   DOB: 05-05-1960, 60 y.o.   MRN: [id number]

## 2020-04-23 NOTE — Progress Notes (Signed)
ANTICOAGULATION CONSULT NOTE   Pharmacy Consult for heparin infusion Indication: ACS/STEMI  No Known Allergies  Patient Measurements: Height: 5\' 6"  (167.6 cm) Weight: 72.6 kg (160 lb 1.6 oz) IBW/kg (Calculated) : 63.8 Heparin Dosing Weight: 73.5 kg  Vital Signs: Temp: 98 F (36.7 C) (04/10 0416) Temp Source: Oral (04/10 0416) BP: 115/69 (04/10 0416) Pulse Rate: 57 (04/10 0416)  Labs: Recent Labs    04/21/20 2206 04/22/20 0016 04/22/20 0401 04/22/20 0711 04/22/20 0711 04/22/20 1343 04/22/20 2047 04/23/20 0536  HGB 15.5  --  15.2  --   --   --   --  15.8  HCT 45.4  --  45.5  --   --   --   --  47.3  PLT 305  --  275  --   --   --   --  269  APTT  --   --  136*  --   --   --   --   --   LABPROT  --   --  12.7  --   --   --   --   --   INR  --   --  1.0  --   --   --   --   --   HEPARINUNFRC  --   --   --  0.33   < > 0.17* 0.29* 0.56  CREATININE 0.94  --  0.88  --   --   --   --   --   TROPONINIHS 159* 725* 3,408* 4,061*  --   --   --   --    < > = values in this interval not displayed.    Estimated Creatinine Clearance: 83.6 mL/min (by C-G formula based on SCr of 0.88 mg/dL).   Medical History: Past Medical History:  Diagnosis Date  . Asthma     Assessment: Pt is 60 yo male presenting w/ CP, Troponin 159 >> 725. Pharmacy has been consulted for heparin infusion dosing.  Tonight, heparin level remains in subtherapeutic range at 0.29 but trended up from bolus and infusion rate increase earlier today. Hgb and platelets WNL. No issues with the infusion or overt bleeding noted per RN.  Goal of Therapy:  Heparin level 0.3-0.7 units/ml Monitor platelets by anticoagulation protocol: Yes   4/10 0536 HL 0.56, therapeutic   Plan:  Continue heparin infusion at 1350 units/hr Recheck HL in 6 hr to confirm Monitor CBC, daily heparin level  Continue to monitor for signs/symptoms of bleeding F/u plans post cath (Monday)  06-20-1993, PharmD, [facility] 04/23/2020 6:29  AM

## 2020-04-23 NOTE — Progress Notes (Signed)
*  PRELIMINARY RESULTS* Echocardiogram 2D Echocardiogram has been performed.  Ricky Ryan Ricky Ryan 04/23/2020, 9:55 AM

## 2020-04-23 NOTE — Progress Notes (Signed)
Patient Name: Ricky Ryan Date of Encounter: 04/23/2020  Hospital Problem List     Principal Problem:   NSTEMI (non-ST elevated myocardial infarction) [facility]) Active Problems:   Asthma   Allergies   GERD (gastroesophageal reflux disease)    Patient Profile      60 y.o. male with history of asthma and GERD but no prior cardiac disease who presented to the er after developing mid sternal chest pain which occurred after playing with his grandkids and preparing to do yard work. He was given asa and TUMS by his wife. EMS arrived and nitro was given. Pain improved by arrival to er and EKG did not suggest stemi. He has ruled in for an mi. Risk factors include family history of cad. Peak hsTroponin is 4061. Echo showed preserved lv funciton with no obvious regional wall motion abnormality. . Pain free at present. Subjective   No chest pain  Inpatient Medications    . aspirin EC  81 mg Oral Daily  . atorvastatin  40 mg Oral Daily  . famotidine  40 mg Oral Daily  . metoprolol tartrate  12.5 mg Oral BID  . montelukast  10 mg Oral Daily  . sodium chloride flush  3 mL Intravenous Q12H    Vital Signs    Vitals:   04/22/20 1511 04/22/20 2003 04/23/20 0416 04/23/20 0728  BP: 128/78 129/78 115/69 113/75  Pulse: 65 66 (!) 57 61  Resp: 14 16 16 16   Temp: 98.2 F (36.8 C) 98.2 F (36.8 C) 98 F (36.7 C) 98.5 F (36.9 C)  TempSrc: Oral Oral Oral Oral  SpO2: 96% 95% 95% 98%  Weight:      Height:        Intake/Output Summary (Last 24 hours) at 04/23/2020 1046 Last data filed at 04/23/2020 1000 Gross per 24 hour  Intake 1970.91 ml  Output 525 ml  Net 1445.91 ml   Filed Weights   04/21/20 2155 04/22/20 0442  Weight: 73.5 kg 72.6 kg    Physical Exam    GEN: Well nourished, well developed, in no acute distress.  HEENT: normal.  Neck: Supple, no JVD, carotid bruits, or masses. Cardiac: RRR, no murmurs, rubs, or gallops. No clubbing, cyanosis, edema.  Radials/DP/PT 2+ and  equal bilaterally.  Respiratory:  Respirations regular and unlabored, clear to auscultation bilaterally. GI: Soft, nontender, nondistended, BS + x 4. MS: no deformity or atrophy. Skin: warm and dry, no rash. Neuro:  Strength and sensation are intact. Psych: Normal affect.  Labs    CBC Recent Labs    04/22/20 0401 04/23/20 0536  WBC 8.3 9.5  HGB 15.2 15.8  HCT 45.5 47.3  MCV 84.4 83.9  PLT 275 269   Basic Metabolic Panel Recent Labs    06/23/20 2206 04/22/20 0401  NA 140 140  K 3.9 3.8  CL 103 107  CO2 27 26  GLUCOSE 100* 96  BUN 23* 20  CREATININE 0.94 0.88  CALCIUM 9.9 9.6  MG  --  2.0   Liver Function Tests Recent Labs    04/21/20 0432  AST 25  ALT 14  ALKPHOS 39  BILITOT 0.6  PROT 5.8*  ALBUMIN 2.5*   Recent Labs    04/21/20 0432  LIPASE 23   Cardiac Enzymes No results for input(s): CKTOTAL, CKMB, CKMBINDEX, TROPONINI in the last 72 hours. BNP No results for input(s): BNP in the last 72 hours. D-Dimer No results for input(s): DDIMER in the last 72 hours. Hemoglobin  A1C Recent Labs    04/22/20 0401  HGBA1C 5.7*   Fasting Lipid Panel Recent Labs    04/22/20 0401  CHOL 200  HDL 52  LDLCALC 124*  TRIG 120  CHOLHDL 3.8   Thyroid Function Tests Recent Labs    04/22/20 0401  TSH 3.621    Telemetry    nsr  ECG    nsr with no ischemia  Radiology    DG Chest 2 View  Result Date: 04/21/2020 CLINICAL DATA:  Chest pain EXAM: CHEST - 2 VIEW COMPARISON:  April 09, 2010 FINDINGS: The heart size and mediastinal contours are within normal limits. Both lungs are clear. The visualized skeletal structures are unremarkable. IMPRESSION: No active cardiopulmonary disease. Electronically Signed   By: Jonna Clark M.D.   On: 04/21/2020 22:35    Assessment & Plan    60 yo male with no prior cardiac disease who presented with chest pain and ruled in for nstemi.   NSTEMI-will continue with asa, statin, heparin, beta bocker and proceed with  left heart cath on Monday to evaluate anatomy. Showed normal lv function with no regional wall motion abnormality. Continue current meds.   Signed, Darlin Priestly Jastin Fore MD 04/23/2020, 10:46 AM  Pager: [phone]

## 2020-04-24 ENCOUNTER — Encounter
Admission: EM | Disposition: A | Discharge status: Home / Self Care | Payer: Self-pay | Source: Home / Self Care | Attending: Internal Medicine

## 2020-04-24 ENCOUNTER — Encounter: Payer: Self-pay | Admitting: Cardiology

## 2020-04-24 HISTORY — PX: LEFT HEART CATH AND CORONARY ANGIOGRAPHY: CATH118249

## 2020-04-24 LAB — CBC
HCT: 47 % (ref 39.0–52.0)
Hemoglobin: 15.5 g/dL (ref 13.0–17.0)
MCH: 27.9 pg (ref 26.0–34.0)
MCHC: 33 g/dL (ref 30.0–36.0)
MCV: 84.7 fL (ref 80.0–100.0)
Platelets: 227 10*3/uL (ref 150–400)
RBC: 5.55 MIL/uL (ref 4.22–5.81)
RDW: 14.4 % (ref 11.5–15.5)
WBC: 9.9 10*3/uL (ref 4.0–10.5)
nRBC: 0 % (ref 0.0–0.2)

## 2020-04-24 LAB — HEPARIN LEVEL (UNFRACTIONATED): Heparin Unfractionated: 0.52 IU/mL (ref 0.30–0.70)

## 2020-04-24 SURGERY — LEFT HEART CATH AND CORONARY ANGIOGRAPHY
Anesthesia: Moderate Sedation

## 2020-04-24 MED ORDER — SODIUM CHLORIDE 0.9% FLUSH: 3.0000 mL | INTRAVENOUS | Status: DC | PRN

## 2020-04-24 MED ORDER — SODIUM CHLORIDE 0.9 % WEIGHT BASED INFUSION: 1.0000 mL/kg/h | INTRAVENOUS | Status: DC

## 2020-04-24 MED ORDER — HYDRALAZINE HCL 20 MG/ML IJ SOLN: 10.0000 mg | INTRAMUSCULAR | Status: DC | PRN

## 2020-04-24 MED ORDER — MIDAZOLAM HCL 2 MG/2ML IJ SOLN
INTRAMUSCULAR | Status: DC | PRN
  Administered 2020-04-24: 1 mg via INTRAVENOUS

## 2020-04-24 MED ORDER — SODIUM CHLORIDE 0.9 % IV SOLN: 250.0000 mL | INTRAVENOUS | Status: DC | PRN

## 2020-04-24 MED ORDER — HEPARIN SODIUM (PORCINE) 1000 UNIT/ML IJ SOLN
INTRAMUSCULAR | Status: AC
  Filled 2020-04-24: qty 1

## 2020-04-24 MED ORDER — HEPARIN SODIUM (PORCINE) 1000 UNIT/ML IJ SOLN
INTRAMUSCULAR | Status: DC | PRN
  Administered 2020-04-24: 3500 [IU] via INTRAVENOUS

## 2020-04-24 MED ORDER — ASPIRIN 81 MG PO TBEC
81.0000 mg | DELAYED_RELEASE_TABLET | Freq: Every day | ORAL | 11 refills | Status: AC
Start: 2020-04-24 — End: ?

## 2020-04-24 MED ORDER — HEPARIN (PORCINE) IN NACL 1000-0.9 UT/500ML-% IV SOLN
INTRAVENOUS | Status: AC
  Filled 2020-04-24: qty 1000

## 2020-04-24 MED ORDER — ONDANSETRON HCL 4 MG/2ML IJ SOLN: 4.0000 mg | Freq: Four times a day (QID) | INTRAMUSCULAR | Status: DC | PRN

## 2020-04-24 MED ORDER — ACETAMINOPHEN 325 MG PO TABS: 650.0000 mg | ORAL_TABLET | ORAL | Status: DC | PRN

## 2020-04-24 MED ORDER — HEPARIN (PORCINE) IN NACL 2000-0.9 UNIT/L-% IV SOLN
INTRAVENOUS | Status: DC | PRN
  Administered 2020-04-24: 1000 mL

## 2020-04-24 MED ORDER — VERAPAMIL HCL 2.5 MG/ML IV SOLN
INTRAVENOUS | Status: DC | PRN
  Administered 2020-04-24: 2.5 mg via INTRAVENOUS

## 2020-04-24 MED ORDER — MIDAZOLAM HCL 2 MG/2ML IJ SOLN
INTRAMUSCULAR | Status: AC
  Filled 2020-04-24: qty 2

## 2020-04-24 MED ORDER — SODIUM CHLORIDE 0.9% FLUSH: 3.0000 mL | Freq: Two times a day (BID) | INTRAVENOUS | Status: DC

## 2020-04-24 MED ORDER — LABETALOL HCL 5 MG/ML IV SOLN: 10.0000 mg | INTRAVENOUS | Status: DC | PRN

## 2020-04-24 MED ORDER — VERAPAMIL HCL 2.5 MG/ML IV SOLN
INTRAVENOUS | Status: AC
  Filled 2020-04-24: qty 2

## 2020-04-24 MED ORDER — SODIUM CHLORIDE 0.9 % IV SOLN
INTRAVENOUS | Status: AC | PRN
  Administered 2020-04-24: 250 mL/h via INTRAVENOUS

## 2020-04-24 MED ORDER — ATORVASTATIN CALCIUM 20 MG PO TABS
40.0000 mg | ORAL_TABLET | Freq: Every day | ORAL | 1 refills | Status: AC
Start: 2020-04-24 — End: ?

## 2020-04-24 MED ORDER — FENTANYL CITRATE (PF) 100 MCG/2ML IJ SOLN
INTRAMUSCULAR | Status: AC
  Filled 2020-04-24: qty 2

## 2020-04-24 MED ORDER — IOHEXOL 300 MG/ML  SOLN
INTRAMUSCULAR | Status: DC | PRN
  Administered 2020-04-24: 101 mL

## 2020-04-24 MED ORDER — FENTANYL CITRATE (PF) 100 MCG/2ML IJ SOLN
INTRAMUSCULAR | Status: DC | PRN
  Administered 2020-04-24: 25 ug via INTRAVENOUS

## 2020-04-24 SURGICAL SUPPLY — 14 items
CATH INFINITI 5 FR JL3.5 (CATHETERS) ×2 IMPLANT
CATH INFINITI 5FR ANG PIGTAIL (CATHETERS) ×2 IMPLANT
CATH INFINITI JR4 5F (CATHETERS) ×2 IMPLANT
DEVICE RAD TR BAND REGULAR (VASCULAR PRODUCTS) ×2 IMPLANT
DRAPE BRACHIAL (DRAPES) ×2 IMPLANT
GLIDESHEATH SLEND SS 6F .021 (SHEATH) ×2 IMPLANT
GUIDEWIRE INQWIRE 1.5J.035X260 (WIRE) ×1 IMPLANT
INQWIRE 1.5J .035X260CM (WIRE) ×2
KIT MANI 3VAL PERCEP (MISCELLANEOUS) IMPLANT
KIT SYRINGE INJ CVI SPIKEX1 (MISCELLANEOUS) ×2 IMPLANT
PACK CARDIAC CATH (CUSTOM PROCEDURE TRAY) ×2 IMPLANT
PROTECTION STATION PRESSURIZED (MISCELLANEOUS) ×2
SET ATX SIMPLICITY (MISCELLANEOUS) ×2 IMPLANT
STATION PROTECTION PRESSURIZED (MISCELLANEOUS) ×1 IMPLANT

## 2020-04-24 NOTE — Discharge Summary (Signed)
[facility] [place] [facility]   PATIENT NAME: Ricky Ryan    MR#:  [id number]  DATE OF BIRTH:  [birth year]  DATE OF ADMISSION:  04/22/2020 ADMITTING PHYSICIAN: Synetta Fail, MD  DATE OF DISCHARGE: 04/24/2020  PRIMARY CARE PHYSICIAN: Dione Housekeeper, MD    ADMISSION DIAGNOSIS:  NSTEMI (non-ST elevated myocardial infarction) (HCC) [I21.4]  DISCHARGE DIAGNOSIS:  NSTEMI--s/p cath with normal coronaries  SECONDARY DIAGNOSIS:   Past Medical History:  Diagnosis Date  . Asthma     HOSPITAL COURSE:   Ricky Theard Priceis a 60 y.o.malewith medical history significant ofasthma, GERD who presented with chest pain. Patient had onset of chest pain around 6 PM this evening. He had a gradual increase in the pain which had a burning nature to it and was about a10out of 10and located substernally   NSTEMI --presented withSternal chest pain after doing yard work and eating a meal. -- Troponin [id number]  -[facility] cardiology Dr Lady Gary input noted -was on IV heparin drip, ASA, add low dose BB, lipitor --prn nitro --pt cp free -Echo EF 60% -Cath  by Dr Cassie Freer-- Normal coronary anatomy. Normal left ventricular function, LV EF > 55% --recommend asa and statins --ok to d/c from cardiology stand[oint  Hyperlipidemia -- on statins  Asthma --Continue daily Singulair  GERD --po famotidine  DVT prophylaxis:Heparin Code Status:Full Family Communication:Wife and dter updated at bedside Disposition Plan: Patient is from:Home Anticipated DC CW:CBJS Anticipated DC date:today Anticipated DC barriers:None Consults called: cardiology, Dr. Lady Gary Level of care: Progressive Cardiac Status is: Inpatient  D/c home later today  CONSULTS OBTAINED:    DRUG ALLERGIES:  No Known  Allergies  DISCHARGE MEDICATIONS:   Allergies as of 04/24/2020   No Known Allergies     Medication List    TAKE these medications   aspirin 81 MG EC tablet Take 1 tablet (81 mg total) by mouth daily. Swallow whole.   atorvastatin 20 MG tablet Commonly known as: LIPITOR Take 2 tablets (40 mg total) by mouth daily.   Linseed Oil Oil Use.   montelukast 10 MG tablet Commonly known as: SINGULAIR Take 10 mg by mouth daily.   Multi-Vitamin tablet Take 1 tablet by mouth daily.       If you experience worsening of your admission symptoms, develop shortness of breath, life threatening emergency, suicidal or homicidal thoughts you must seek medical attention immediately by calling 911 or calling your MD immediately  if symptoms less severe.  You Must read complete instructions/literature along with all the possible adverse reactions/side effects for all the Medicines you take and that have been prescribed to you. Take any new Medicines after you have completely understood and accept all the possible adverse reactions/side effects.   Please note  You were cared for by a hospitalist during your hospital stay. If you have any questions about your discharge medications or the care you received while you were in the hospital after you are discharged, you can call the unit and asked to speak with the hospitalist on call if the hospitalist that took care of you is not available. Once you are discharged, your primary care physician will handle any further medical issues. Please note that NO REFILLS for any discharge medications will be authorized once you are discharged, as it is imperative that you return to your primary care physician (or establish a relationship with a primary care physician if you do not have one) for your aftercare needs so that they can reassess  your need for medications and monitor your lab values. Today   SUBJECTIVE   Post cath doing ok Family in the room  VITAL  SIGNS:  Blood pressure 106/74, pulse 65, temperature 98.1 F (36.7 C), resp. rate 12, height  (1.676 m), weight 70.5 kg, SpO2 96 %.  I/O:    Intake/Output Summary (Last 24 hours) at 04/24/2020 1203 Last data filed at 04/23/2020 2100 Gross per 24 hour  Intake 1200 ml  Output 300 ml  Net 900 ml    PHYSICAL EXAMINATION:  GENERAL:  60 y.o.-year-old patient lying in the bed with no acute distress.  LUNGS: Normal breath sounds bilaterally, no wheezing, rales,rhonchi or crepitation. No use of accessory muscles of respiration.  CARDIOVASCULAR: S1, S2 normal. No murmurs, rubs, or gallops.  ABDOMEN: Soft, non-tender, non-distended. Bowel sounds present. No organomegaly or mass.  EXTREMITIES: No pedal edema, cyanosis, or clubbing.  NEUROLOGIC: Cranial nerves II through XII are intact. Muscle strength 5/5 in all extremities. Sensation intact. Gait not checked.  PSYCHIATRIC: The patient is alert and oriented x 3.  SKIN: No obvious rash, lesion, or ulcer.   DATA REVIEW:   CBC  Recent Labs  Lab 04/24/20 0545  WBC 9.9  HGB 15.5  HCT 47.0  PLT 227    Chemistries  Recent Labs  Lab 04/21/20 0432 04/21/20 2206 04/22/20 0401 04/23/20 1132  NA  --    < > 140 140  K  --    < > 3.8 4.1  CL  --    < > 107 106  CO2  --    < > 26 26  GLUCOSE  --    < > 96 97  BUN  --    < > 20 18  CREATININE  --    < > 0.88 0.91  CALCIUM  --    < > 9.6 9.2  MG  --   --  2.0  --   AST 25  --   --   --   ALT 14  --   --   --   ALKPHOS 39  --   --   --   BILITOT 0.6  --   --   --    < > = values in this interval not displayed.    Microbiology Results   Recent Results (from the past 240 hour(s))  Resp Panel by RT-PCR (Flu A&B, Covid) Nasopharyngeal Swab     Status: None   Collection Time: 04/22/20 12:44 AM   Specimen: Nasopharyngeal Swab; Nasopharyngeal(NP) swabs in vial transport medium  Result Value Ref Range Status   SARS Coronavirus 2 by RT PCR NEGATIVE NEGATIVE Final    Comment:  (NOTE) SARS-CoV-2 target nucleic acids are NOT DETECTED.  The SARS-CoV-2 RNA is generally detectable in upper respiratory specimens during the acute phase of infection. The lowest concentration of SARS-CoV-2 viral copies this assay can detect is 138 copies/mL. A negative result does not preclude SARS-Cov-2 infection and should not be used as the sole basis for treatment or other patient management decisions. A negative result may occur with  improper specimen collection/handling, submission of specimen other than nasopharyngeal swab, presence of viral mutation(s) within the areas targeted by this assay, and inadequate number of viral copies(<138 copies/mL). A negative result must be combined with clinical observations, patient history, and epidemiological information. The expected result is Negative.  Fact Sheet for Patients:  BloggerCourse.com  Fact Sheet for Healthcare Providers:  SeriousBroker.it  This test is  no t yet approved or cleared by the [place] FDA and  has been authorized for detection and/or diagnosis of SARS-CoV-2 by FDA under an Emergency Use Authorization (EUA). This EUA will remain  in effect (meaning this test can be used) for the duration of the COVID-19 declaration under Section 564(b)(1) of the Act, 21 U.S.C.section 360bbb-3(b)(1), unless the authorization is terminated  or revoked sooner.       Influenza A by PCR NEGATIVE NEGATIVE Final   Influenza B by PCR NEGATIVE NEGATIVE Final    Comment: (NOTE) The Xpert Xpress SARS-CoV-2/FLU/RSV plus assay is intended as an aid in the diagnosis of influenza from Nasopharyngeal swab specimens and should not be used as a sole basis for treatment. Nasal washings and aspirates are unacceptable for Xpert Xpress SARS-CoV-2/FLU/RSV testing.  Fact Sheet for Patients: BloggerCourse.comhttps://www.fda.gov/media/152166/download  Fact Sheet for Healthcare  Providers: SeriousBroker.ithttps://www.fda.gov/media/152162/download  This test is not yet approved or cleared by the [place] FDA and has been authorized for detection and/or diagnosis of SARS-CoV-2 by FDA under an Emergency Use Authorization (EUA). This EUA will remain in effect (meaning this test can be used) for the duration of the COVID-19 declaration under Section 564(b)(1) of the Act, 21 U.S.C. section 360bbb-3(b)(1), unless the authorization is terminated or revoked.  Performed at [facility], [address] [place], [place] [address]     RADIOLOGY:  CARDIAC CATHETERIZATION  Result Date: 04/24/2020 1.  Normal coronary anatomy 2.  Normal left ventricular function, LV EF > 55%  ECHOCARDIOGRAM COMPLETE  Result Date: 04/23/2020    ECHOCARDIOGRAM REPORT   Patient Name:   Golden PopUL ALLEN Langenbach Date of Exam: 04/23/2020 Medical Rec #:  [id number]        Height:       66.0 in Accession #:    [id number]       Weight:       160.1 lb Date of Birth:  [birth year]       BSA:          1.819 m Patient Age:    59 years         BP:           115/69 mmHg Patient Gender: M                HR:           62 bpm. Exam Location:  [facility] Procedure: 2D Echo and Strain Analysis Indications:     Chest Pain R07.9  History:         Patient has no prior history of Echocardiogram examinations.                  Acute MI; Signs/Symptoms:Chest Pain.  Sonographer:     Wonda Ceriseikeshia Stills RDCS Referring Phys:  [id number] Cecille PoLEXANDER B MELVIN Diagnosing Phys: Harold HedgeKenneth Fath MD IMPRESSIONS  1. Left ventricular ejection fraction, by estimation, is 65 to 70%. The left ventricle has normal function. The left ventricle has no regional wall motion abnormalities. There is mild left ventricular hypertrophy. Left ventricular diastolic parameters were normal.  2. Right ventricular systolic function is normal. The right ventricular size is mildly enlarged.  3. The mitral valve is normal in structure. Mild mitral valve regurgitation.  4. The aortic valve  is tricuspid. Aortic valve regurgitation is not visualized. FINDINGS  Left Ventricle: Left ventricular ejection fraction, by estimation, is 65 to 70%. The left ventricle has normal function. The left ventricle has no regional wall motion abnormalities. The left ventricular  internal cavity size was normal in size. There is  mild left ventricular hypertrophy. Left ventricular diastolic parameters were normal. Right Ventricle: The right ventricular size is mildly enlarged. No increase in right ventricular wall thickness. Right ventricular systolic function is normal. Left Atrium: Left atrial size was normal in size. Right Atrium: Right atrial size was normal in size. Pericardium: There is no evidence of pericardial effusion. Mitral Valve: The mitral valve is normal in structure. Mild mitral valve regurgitation. Tricuspid Valve: The tricuspid valve is grossly normal. Tricuspid valve regurgitation is trivial. Aortic Valve: The aortic valve is tricuspid. Aortic valve regurgitation is not visualized. Aortic valve peak gradient measures 8.2 mmHg. Pulmonic Valve: The pulmonic valve was not well visualized. Pulmonic valve regurgitation is trivial. Aorta: The aortic root is normal in size and structure. IAS/Shunts: The interatrial septum was not assessed.  LEFT VENTRICLE PLAX 2D LVIDd:         3.98 cm  Diastology LVIDs:         2.60 cm  LV e' medial:    6.96 cm/s LV PW:         1.12 cm  LV E/e' medial:  8.3 LV IVS:        1.15 cm  LV e' lateral:   11.10 cm/s LVOT diam:     1.90 cm  LV E/e' lateral: 5.2 LV SV:         54 LV SV Index:   29 LVOT Area:     2.84 cm  RIGHT VENTRICLE RV Basal diam:  3.31 cm RV S prime:     21.50 cm/s TAPSE (M-mode): 2.3 cm LEFT ATRIUM           Index       RIGHT ATRIUM          Index LA diam:      3.00 cm 1.65 cm/m  RA Area:     7.67 cm LA Vol (A2C): 27.4 ml 15.06 ml/m RA Volume:   12.90 ml 7.09 ml/m LA Vol (A4C): 25.2 ml 13.85 ml/m  AORTIC VALVE                PULMONIC VALVE AV Area (Vmax):  1.80 cm    PV Vmax:       0.87 m/s AV Vmax:        143.00 cm/s PV Peak grad:  3.0 mmHg AV Peak Grad:   8.2 mmHg LVOT Vmax:      90.70 cm/s LVOT Vmean:     60.900 cm/s LVOT VTI:       0.189 m  AORTA Ao Root diam: 3.20 cm Ao Asc diam:  3.00 cm MITRAL VALVE               TRICUSPID VALVE MV Area (PHT): 3.99 cm    TV Peak grad:   19.8 mmHg MV Decel Time: 190 msec    TV Vmax:        2.22 m/s MV E velocity: 57.80 cm/s MV A velocity: 57.40 cm/s  SHUNTS MV E/A ratio:  1.01        Systemic VTI:  0.19 m                            Systemic Diam: 1.90 cm Harold Hedge MD Electronically signed by Harold Hedge MD Signature Date/Time: 04/23/2020/12:29:16 PM    Final      CODE STATUS:     Code Status Orders  (  From admission, onward)         Start     Ordered   04/22/20 0057  Full code  Continuous        04/22/20 0059        Code Status History    This patient has a current code status but no historical code status.   Advance Care Planning Activity       TOTAL TIME TAKING CARE OF THIS PATIENT: *35* minutes.    Enedina Finner M.D  [facility]    CC: Primary care physician; Zada Finders Joycie Peek, MD

## 2020-04-24 NOTE — Progress Notes (Signed)
Patient Name: Ricky Ryan Date of Encounter: 04/24/2020  Hospital Problem List     Principal Problem:   NSTEMI (non-ST elevated myocardial infarction) [facility]) Active Problems:   Asthma   Allergies   GERD (gastroesophageal reflux disease)    Patient Profile     60 y.o.malewith history ofasthma and GERD but no prior cardiac disease who presented to the er after developing mid sternal chest pain which occurred after playing with his grandkids and preparing to do yard work. He was given asa and TUMS by his wife. EMS arrived and nitro was given. Pain improved by arrival to er and EKG did not suggest stemi. He has ruled in for an mi. Risk factors include family history of cad. Peak hsTroponin is 4061. Echo showed preserved lv funciton with no obvious regional wall motion abnormality. . Pain free at present.  Subjective   No chest pain  Inpatient Medications    . [MAR Hold] aspirin EC  81 mg Oral Daily  . [MAR Hold] atorvastatin  40 mg Oral Daily  . [MAR Hold] famotidine  40 mg Oral Daily  . [MAR Hold] metoprolol tartrate  12.5 mg Oral BID  . [MAR Hold] montelukast  10 mg Oral Daily  . [facility] Hold] sodium chloride flush  3 mL Intravenous Q12H    Vital Signs    Vitals:   04/23/20 2000 04/24/20 0404 04/24/20 0415 04/24/20 0656  BP: 123/72 120/70  129/89  Pulse: 68 64  60  Resp: 16 16  13   Temp: 98.3 F (36.8 C) 98.3 F (36.8 C)  98.1 F (36.7 C)  TempSrc: Oral Oral    SpO2: 96% 96%  95%  Weight:   70.5 kg   Height:        Intake/Output Summary (Last 24 hours) at 04/24/2020 0705 Last data filed at 04/23/2020 2100 Gross per 24 hour  Intake 2040 ml  Output 850 ml  Net 1190 ml   Filed Weights   04/21/20 2155 04/22/20 0442 04/24/20 0415  Weight: 73.5 kg 72.6 kg 70.5 kg    Physical Exam    GEN: Well nourished, well developed, in no acute distress.  HEENT: normal.  Neck: Supple, no JVD, carotid bruits, or masses. Cardiac: RRR, no murmurs, rubs, or gallops. No  clubbing, cyanosis, edema.  Radials/DP/PT 2+ and equal bilaterally.  Respiratory:  Respirations regular and unlabored, clear to auscultation bilaterally. GI: Soft, nontender, nondistended, BS + x 4. MS: no deformity or atrophy. Skin: warm and dry, no rash. Neuro:  Strength and sensation are intact. Psych: Normal affect.  Labs    CBC Recent Labs    04/23/20 1132 04/24/20 0545  WBC 8.5 9.9  HGB 15.7 15.5  HCT 47.8 47.0  MCV 84.3 84.7  PLT 287 227   Basic Metabolic Panel Recent Labs    06/24/20 0401 04/23/20 1132  NA 140 140  K 3.8 4.1  CL 107 106  CO2 26 26  GLUCOSE 96 97  BUN 20 18  CREATININE 0.88 0.91  CALCIUM 9.6 9.2  MG 2.0  --    Liver Function Tests No results for input(s): AST, ALT, ALKPHOS, BILITOT, PROT, ALBUMIN in the last 72 hours. No results for input(s): LIPASE, AMYLASE in the last 72 hours. Cardiac Enzymes No results for input(s): CKTOTAL, CKMB, CKMBINDEX, TROPONINI in the last 72 hours. BNP No results for input(s): BNP in the last 72 hours. D-Dimer No results for input(s): DDIMER in the last 72 hours. Hemoglobin A1C Recent Labs  04/22/20 0401  HGBA1C 5.7*   Fasting Lipid Panel Recent Labs    04/22/20 0401  CHOL 200  HDL 52  LDLCALC 124*  TRIG 120  CHOLHDL 3.8   Thyroid Function Tests Recent Labs    04/22/20 0401  TSH 3.621    Telemetry    nsr  ECG    nsr with no ischemia  Radiology    DG Chest 2 View  Result Date: 04/21/2020 CLINICAL DATA:  Chest pain EXAM: CHEST - 2 VIEW COMPARISON:  April 09, 2010 FINDINGS: The heart size and mediastinal contours are within normal limits. Both lungs are clear. The visualized skeletal structures are unremarkable. IMPRESSION: No active cardiopulmonary disease. Electronically Signed   By: Jonna Clark M.D.   On: 04/21/2020 22:35   ECHOCARDIOGRAM COMPLETE  Result Date: 04/23/2020    ECHOCARDIOGRAM REPORT   Patient Name:   Ricky Ryan Date of Exam: 04/23/2020 Medical Rec #:  [id number]         Height:       66.0 in Accession #:    [id number]       Weight:       160.1 lb Date of Birth:  [birth year]       BSA:          1.819 m Patient Age:    59 years         BP:           115/69 mmHg Patient Gender: M                HR:           62 bpm. Exam Location:  [facility] Procedure: 2D Echo and Strain Analysis Indications:     Chest Pain R07.9  History:         Patient has no prior history of Echocardiogram examinations.                  Acute MI; Signs/Symptoms:Chest Pain.  Sonographer:     Wonda Cerise RDCS Referring Phys:  [id number] Cecille Po MELVIN Diagnosing Phys: Harold Hedge MD IMPRESSIONS  1. Left ventricular ejection fraction, by estimation, is 65 to 70%. The left ventricle has normal function. The left ventricle has no regional wall motion abnormalities. There is mild left ventricular hypertrophy. Left ventricular diastolic parameters were normal.  2. Right ventricular systolic function is normal. The right ventricular size is mildly enlarged.  3. The mitral valve is normal in structure. Mild mitral valve regurgitation.  4. The aortic valve is tricuspid. Aortic valve regurgitation is not visualized. FINDINGS  Left Ventricle: Left ventricular ejection fraction, by estimation, is 65 to 70%. The left ventricle has normal function. The left ventricle has no regional wall motion abnormalities. The left ventricular internal cavity size was normal in size. There is  mild left ventricular hypertrophy. Left ventricular diastolic parameters were normal. Right Ventricle: The right ventricular size is mildly enlarged. No increase in right ventricular wall thickness. Right ventricular systolic function is normal. Left Atrium: Left atrial size was normal in size. Right Atrium: Right atrial size was normal in size. Pericardium: There is no evidence of pericardial effusion. Mitral Valve: The mitral valve is normal in structure. Mild mitral valve regurgitation. Tricuspid Valve: The tricuspid valve is grossly normal.  Tricuspid valve regurgitation is trivial. Aortic Valve: The aortic valve is tricuspid. Aortic valve regurgitation is not visualized. Aortic valve peak gradient measures 8.2 mmHg. Pulmonic Valve: The pulmonic valve was not well visualized. Pulmonic valve  regurgitation is trivial. Aorta: The aortic root is normal in size and structure. IAS/Shunts: The interatrial septum was not assessed.  LEFT VENTRICLE PLAX 2D LVIDd:         3.98 cm  Diastology LVIDs:         2.60 cm  LV e' medial:    6.96 cm/s LV PW:         1.12 cm  LV E/e' medial:  8.3 LV IVS:        1.15 cm  LV e' lateral:   11.10 cm/s LVOT diam:     1.90 cm  LV E/e' lateral: 5.2 LV SV:         54 LV SV Index:   29 LVOT Area:     2.84 cm  RIGHT VENTRICLE RV Basal diam:  3.31 cm RV S prime:     21.50 cm/s TAPSE (M-mode): 2.3 cm LEFT ATRIUM           Index       RIGHT ATRIUM          Index LA diam:      3.00 cm 1.65 cm/m  RA Area:     7.67 cm LA Vol (A2C): 27.4 ml 15.06 ml/m RA Volume:   12.90 ml 7.09 ml/m LA Vol (A4C): 25.2 ml 13.85 ml/m  AORTIC VALVE                PULMONIC VALVE AV Area (Vmax): 1.80 cm    PV Vmax:       0.87 m/s AV Vmax:        143.00 cm/s PV Peak grad:  3.0 mmHg AV Peak Grad:   8.2 mmHg LVOT Vmax:      90.70 cm/s LVOT Vmean:     60.900 cm/s LVOT VTI:       0.189 m  AORTA Ao Root diam: 3.20 cm Ao Asc diam:  3.00 cm MITRAL VALVE               TRICUSPID VALVE MV Area (PHT): 3.99 cm    TV Peak grad:   19.8 mmHg MV Decel Time: 190 msec    TV Vmax:        2.22 m/s MV E velocity: 57.80 cm/s MV A velocity: 57.40 cm/s  SHUNTS MV E/A ratio:  1.01        Systemic VTI:  0.19 m                            Systemic Diam: 1.90 cm Harold Hedge MD Electronically signed by Harold Hedge MD Signature Date/Time: 04/23/2020/12:29:16 PM    Final     Assessment & Plan    60 yo male with no prior cardiac disease who presented with chest pain and ruled in for nstemi.   NSTEMI-will continue with asa, statin, heparin, beta bocker and proceed with left heart  cath on Monday today Echo showed no regional wall motion abnormality. Continue current meds.   Signed, Darlin Priestly Avynn Klassen MD 04/24/2020, 7:05 AM  Pager: [phone]

## 2020-04-24 NOTE — Progress Notes (Signed)
ANTICOAGULATION CONSULT NOTE   Pharmacy Consult for heparin infusion Indication: ACS/STEMI  No Known Allergies  Patient Measurements: Height: 5\' 6"  (167.6 cm) Weight: 70.5 kg (155 lb 8 oz) IBW/kg (Calculated) : 63.8 Heparin Dosing Weight: 73.5 kg  Vital Signs: Temp: 98.3 F (36.8 C) (04/11 0404) Temp Source: Oral (04/11 0404) BP: 120/70 (04/11 0404) Pulse Rate: 64 (04/11 0404)  Labs: Recent Labs    04/21/20 2206 04/22/20 0016 04/22/20 0401 04/22/20 0711 04/22/20 1343 04/23/20 0536 04/23/20 1132 04/24/20 0545  HGB 15.5  --  15.2  --   --  15.8 15.7 15.5  HCT 45.4  --  45.5  --   --  47.3 47.8 47.0  PLT 305  --  275  --   --  269 287 227  APTT  --   --  136*  --   --   --   --   --   LABPROT  --   --  12.7  --   --   --   --   --   INR  --   --  1.0  --   --   --   --   --   HEPARINUNFRC  --   --   --  0.33   < > 0.56 0.42 0.52  CREATININE 0.94  --  0.88  --   --   --  0.91  --   TROPONINIHS 159* 725* 3,408* 4,061*  --   --   --   --    < > = values in this interval not displayed.    Estimated Creatinine Clearance: 80.8 mL/min (by C-G formula based on SCr of 0.91 mg/dL).   Medical History: Past Medical History:  Diagnosis Date  . Asthma     Assessment: Pt is 60 yo male presenting w/ CP, Troponin 159 >> 725. Pharmacy has been consulted for heparin infusion dosing.  Goal of Therapy:  Heparin level 0.3-0.7 units/ml Monitor platelets by anticoagulation protocol: Yes   4/10 0536 HL 0.56, therapeutic  4/10 1132 HL 0.42, therapeutic.  4/11 0545 HL 0.52, therapeutic  Plan:  Heparin level is therapeutic. Will continue heparin infusion at 1350 units/hr. Plan for cath on Monday. Will order HL and CBC with AM labs if pt remains on heparin uninterrupted.   Sunday, PharmD, [facility] 04/24/2020 6:38 AM

## 2020-04-24 NOTE — Discharge Planning (Signed)
IV x2 and tele removed.  Discharge papers given, explained and educated.  Discussed sughgested FU appts and appts made.  Told of scripts at pharmacy and when important to contact a Dr. Or return to the ER.  Once ready, will be wheeled to front and family transporting home via car.

## 2022-02-22 IMAGING — CR DG CHEST 2V
1 series · 2 of 2 positions shown · non-contrast
Comparison: April 09, 2010

CLINICAL DATA: Chest pain

EXAM:
CHEST - 2 VIEW

[Series 1: dg chest 2 view · 0.14mm/px · 2 of 2 slices shown]
[im 1/2]
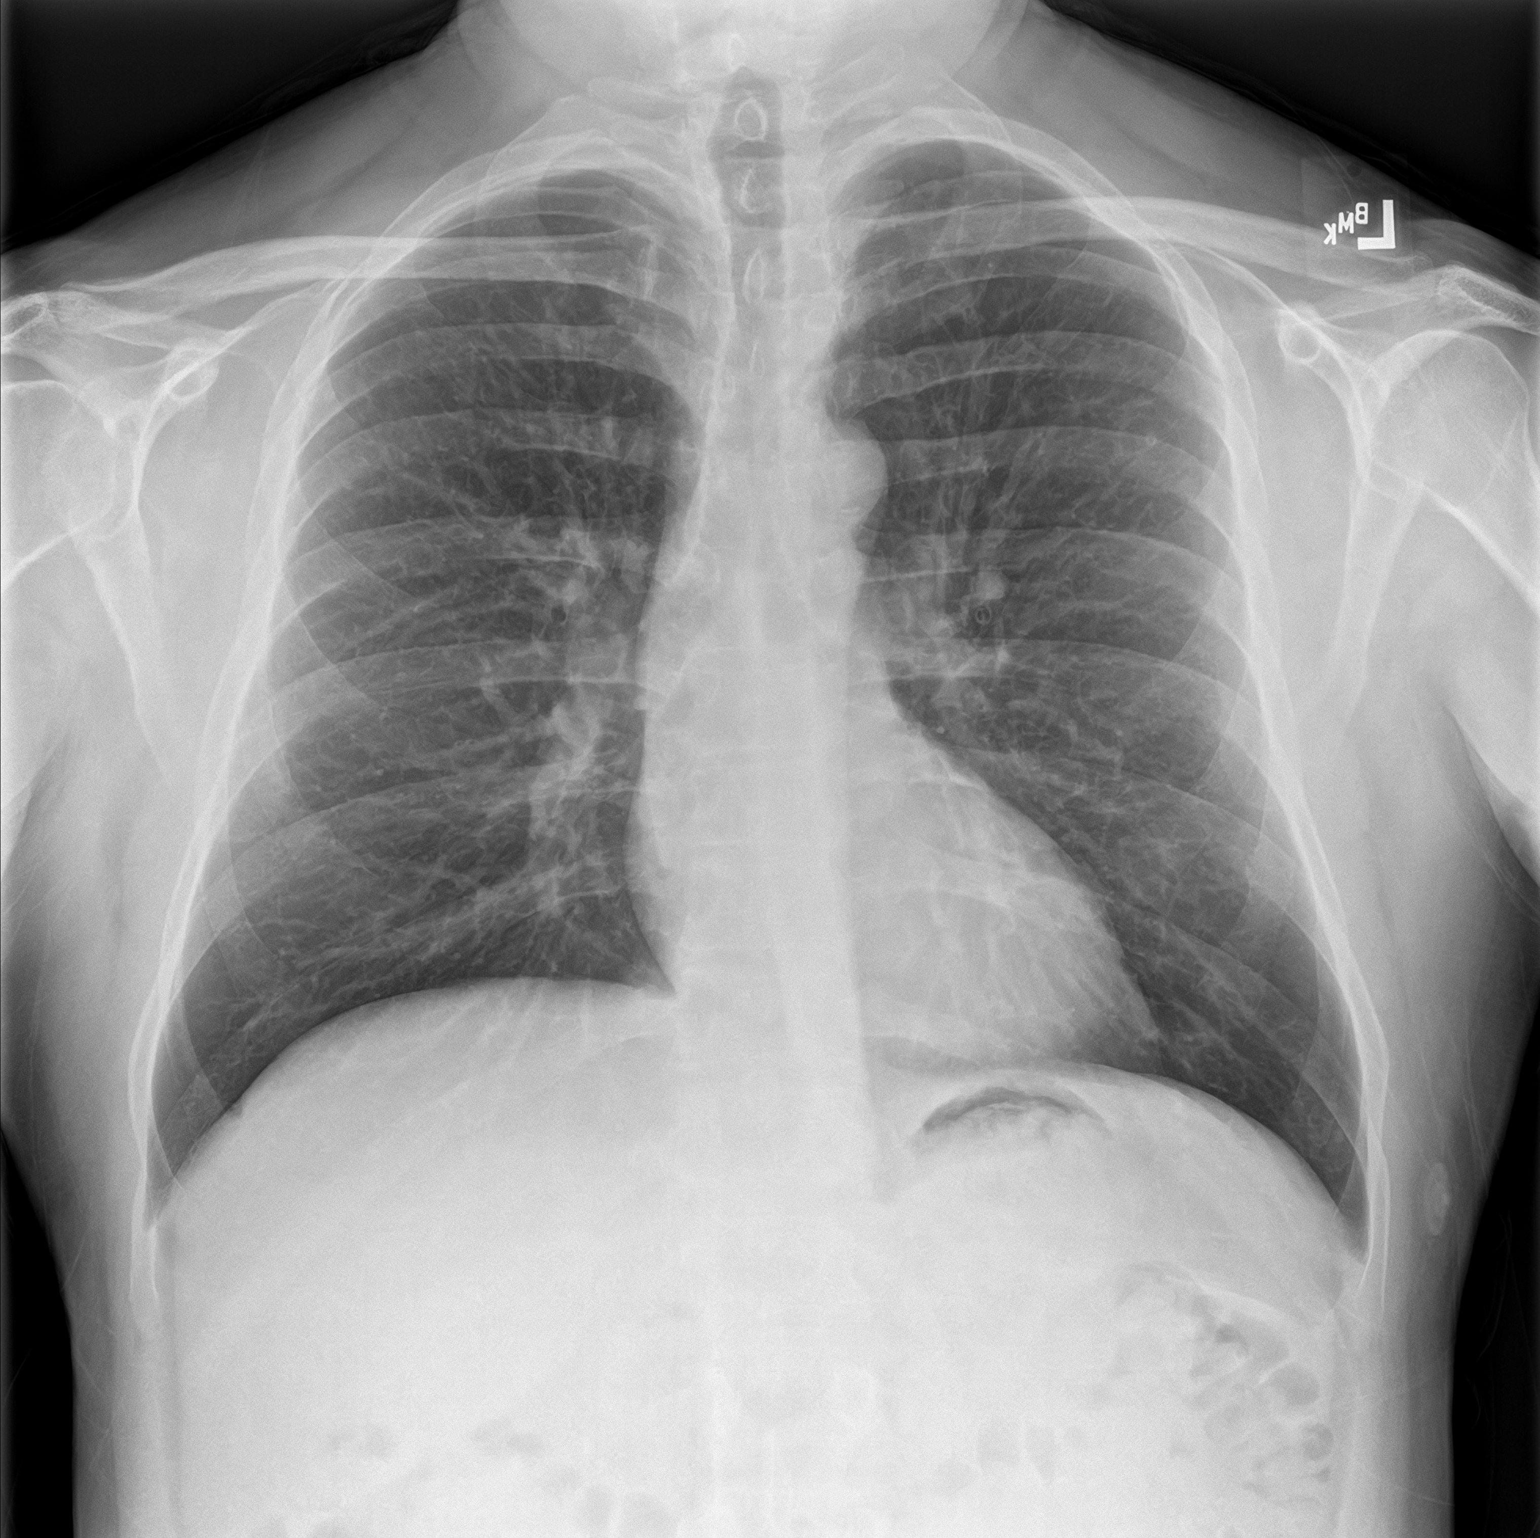
[im 2/2]
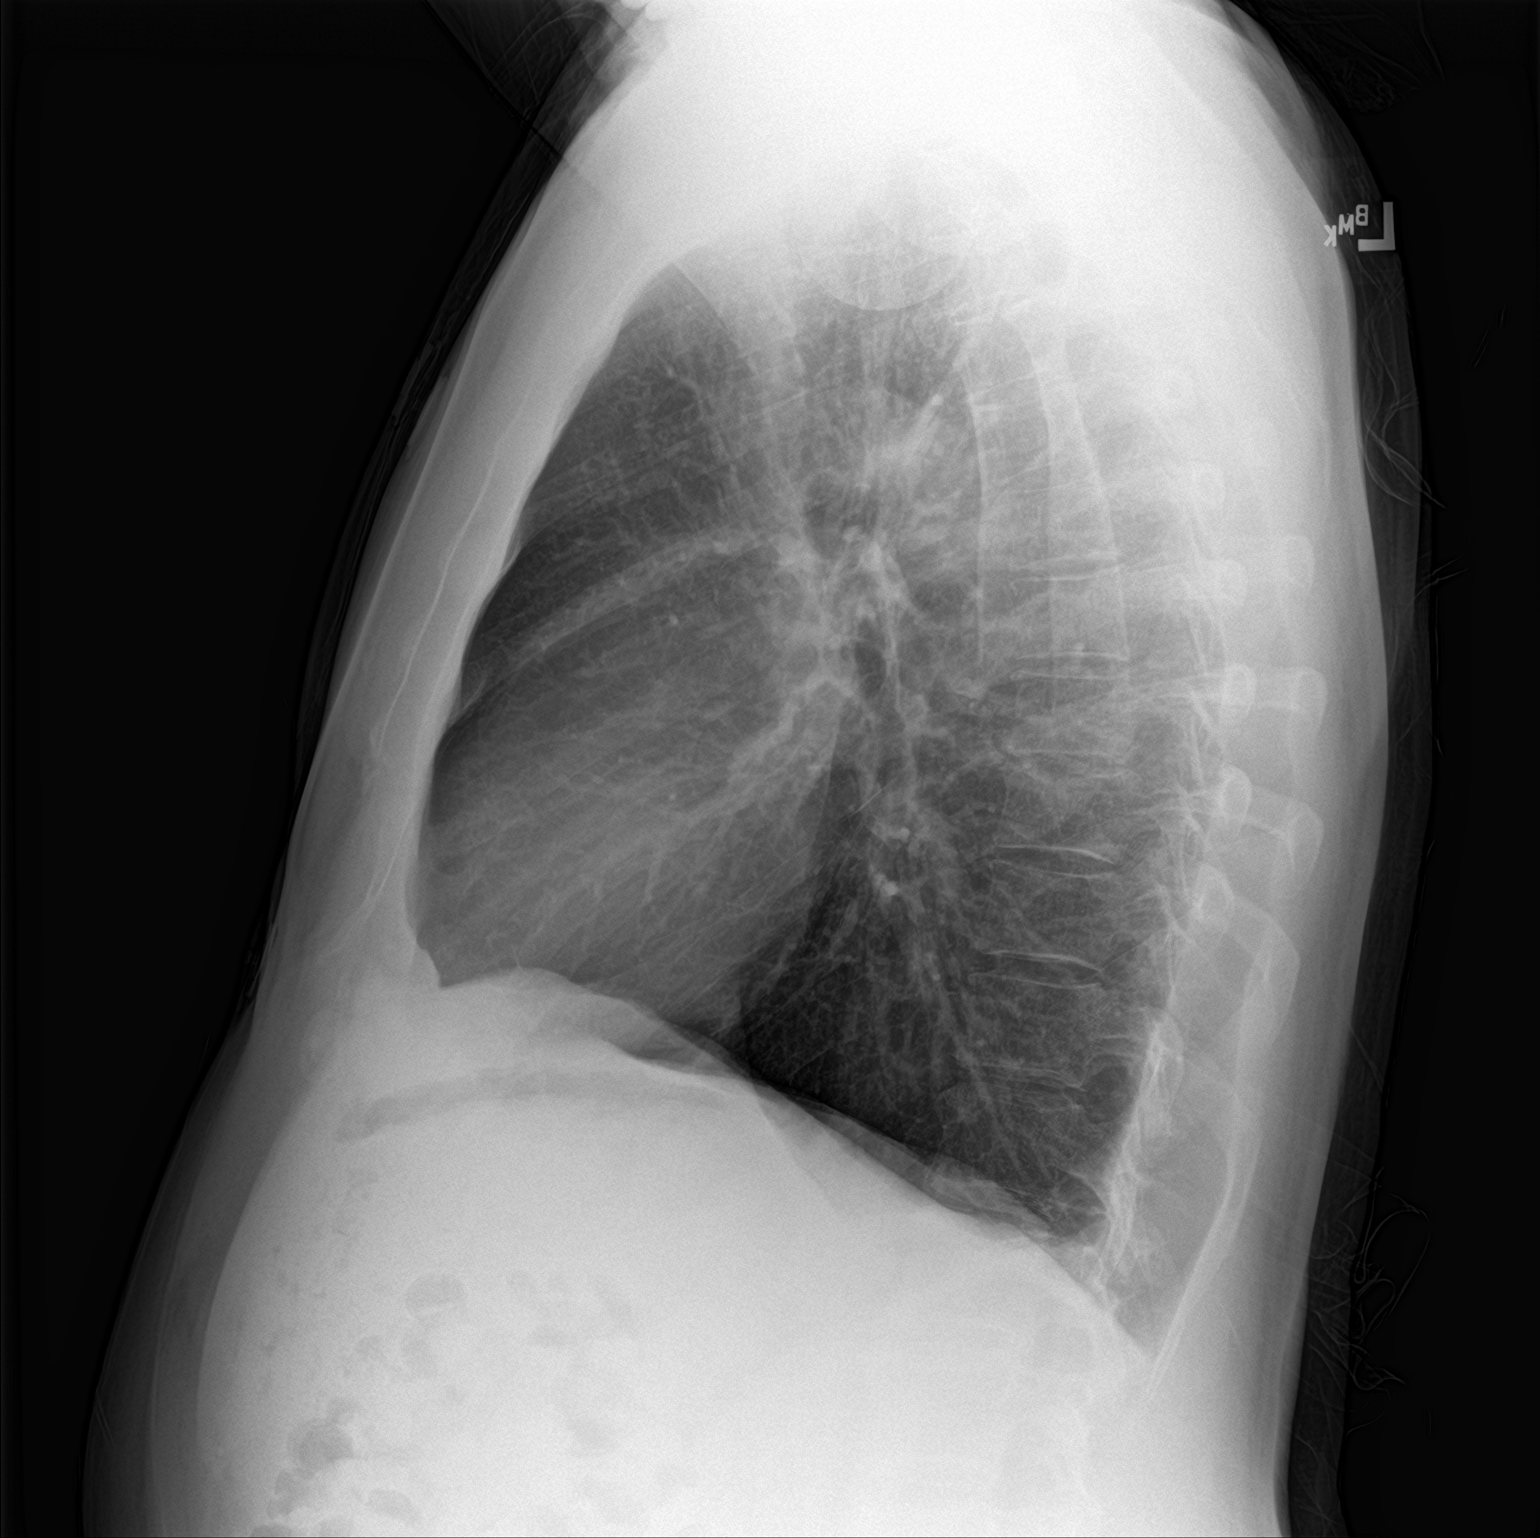

[2 of 2 positions shown; findings below may reference images not displayed]

FINDINGS: The heart size and mediastinal contours are within normal limits.
Both lungs are clear. The visualized skeletal structures are
unremarkable.
IMPRESSION: No active cardiopulmonary disease.

## 2022-07-08 ENCOUNTER — Other Ambulatory Visit: Payer: Self-pay | Admitting: Internal Medicine

## 2022-07-08 DIAGNOSIS — E782 Mixed hyperlipidemia: Secondary | ICD-10-CM

## 2022-07-22 ENCOUNTER — Ambulatory Visit
Admission: RE | Admit: 2022-07-22 | Discharge: 2022-07-22 | Disposition: A | Discharge status: Home / Self Care | Payer: Self-pay | Source: Ambulatory Visit | Attending: Internal Medicine | Admitting: Internal Medicine

## 2022-07-22 DIAGNOSIS — E782 Mixed hyperlipidemia: Secondary | ICD-10-CM | POA: Insufficient documentation
